# Patient Record
Sex: Female | Born: 1950 | Race: White | Hispanic: No | Marital: Married | Smoking: Never smoker
Health system: Southern US, Community
[De-identification: ages and names within clinical notes are randomized; demographics above are authoritative.]

## PROBLEM LIST (undated history)

## (undated) DIAGNOSIS — H18603 Keratoconus, unspecified, bilateral: Secondary | ICD-10-CM

## (undated) DIAGNOSIS — R112 Nausea with vomiting, unspecified: Secondary | ICD-10-CM

## (undated) DIAGNOSIS — Z9889 Other specified postprocedural states: Secondary | ICD-10-CM

## (undated) DIAGNOSIS — M8580 Other specified disorders of bone density and structure, unspecified site: Secondary | ICD-10-CM

## (undated) DIAGNOSIS — J45909 Unspecified asthma, uncomplicated: Secondary | ICD-10-CM

## (undated) HISTORY — DX: Keratoconus, unspecified, bilateral: H18.603

## (undated) HISTORY — PX: SALPINGOOPHORECTOMY: SHX82

## (undated) HISTORY — DX: Other specified disorders of bone density and structure, unspecified site: M85.80

## (undated) HISTORY — DX: Unspecified asthma, uncomplicated: J45.909

---
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
## 1971-04-14 HISTORY — PX: DILATION AND CURETTAGE OF UTERUS: SHX78

## 1994-04-13 HISTORY — PX: CHOLECYSTECTOMY: SHX55

## 1998-01-15 ENCOUNTER — Other Ambulatory Visit: Admission: RE | Admit: 1998-01-15 | Discharge: 1998-01-15 | Payer: Self-pay | Admitting: Gynecology

## 1999-01-13 ENCOUNTER — Inpatient Hospital Stay (HOSPITAL_COMMUNITY): Admission: AD | Admit: 1999-01-13 | Discharge: 1999-01-13 | Payer: Self-pay | Admitting: Gynecology

## 2001-04-13 HISTORY — PX: BREAST BIOPSY: SHX20

## 2001-04-13 HISTORY — PX: BREAST LUMPECTOMY: SHX2

## 2001-11-14 ENCOUNTER — Ambulatory Visit (HOSPITAL_COMMUNITY): Admission: RE | Admit: 2001-11-14 | Discharge: 2001-11-14 | Payer: Self-pay | Admitting: Obstetrics and Gynecology

## 2001-11-14 ENCOUNTER — Encounter: Payer: Self-pay | Admitting: Obstetrics and Gynecology

## 2001-12-02 ENCOUNTER — Ambulatory Visit (HOSPITAL_COMMUNITY): Admission: RE | Admit: 2001-12-02 | Discharge: 2001-12-02 | Payer: Self-pay | Admitting: Obstetrics and Gynecology

## 2001-12-02 ENCOUNTER — Encounter: Payer: Self-pay | Admitting: Obstetrics and Gynecology

## 2001-12-09 ENCOUNTER — Ambulatory Visit (HOSPITAL_COMMUNITY): Admission: RE | Admit: 2001-12-09 | Discharge: 2001-12-09 | Payer: Self-pay | Admitting: General Surgery

## 2002-08-31 ENCOUNTER — Encounter: Payer: Self-pay | Admitting: Obstetrics and Gynecology

## 2002-08-31 ENCOUNTER — Ambulatory Visit (HOSPITAL_COMMUNITY): Admission: RE | Admit: 2002-08-31 | Discharge: 2002-08-31 | Payer: Self-pay | Admitting: Obstetrics and Gynecology

## 2002-10-02 ENCOUNTER — Ambulatory Visit (HOSPITAL_COMMUNITY): Admission: RE | Admit: 2002-10-02 | Discharge: 2002-10-02 | Payer: Self-pay | Admitting: Obstetrics & Gynecology

## 2002-10-02 ENCOUNTER — Encounter: Payer: Self-pay | Admitting: Obstetrics and Gynecology

## 2003-01-24 ENCOUNTER — Ambulatory Visit (HOSPITAL_COMMUNITY): Admission: RE | Admit: 2003-01-24 | Discharge: 2003-01-24 | Payer: Self-pay | Admitting: Obstetrics and Gynecology

## 2003-01-24 ENCOUNTER — Encounter: Payer: Self-pay | Admitting: Obstetrics and Gynecology

## 2003-04-18 ENCOUNTER — Other Ambulatory Visit: Admission: RE | Admit: 2003-04-18 | Discharge: 2003-04-18 | Payer: Self-pay | Admitting: Dermatology

## 2003-05-11 ENCOUNTER — Other Ambulatory Visit: Admission: RE | Admit: 2003-05-11 | Discharge: 2003-05-11 | Payer: Self-pay | Admitting: Obstetrics and Gynecology

## 2004-01-31 ENCOUNTER — Ambulatory Visit (HOSPITAL_COMMUNITY): Admission: RE | Admit: 2004-01-31 | Discharge: 2004-01-31 | Payer: Self-pay | Admitting: Obstetrics and Gynecology

## 2004-04-13 HISTORY — PX: OTHER SURGICAL HISTORY: SHX169

## 2004-05-27 ENCOUNTER — Other Ambulatory Visit: Admission: RE | Admit: 2004-05-27 | Discharge: 2004-05-27 | Payer: Self-pay | Admitting: Obstetrics and Gynecology

## 2004-12-26 ENCOUNTER — Encounter (INDEPENDENT_AMBULATORY_CARE_PROVIDER_SITE_OTHER): Payer: Self-pay | Admitting: Specialist

## 2004-12-26 ENCOUNTER — Ambulatory Visit (HOSPITAL_COMMUNITY): Admission: RE | Admit: 2004-12-26 | Discharge: 2004-12-26 | Payer: Self-pay | Admitting: Obstetrics and Gynecology

## 2005-04-17 ENCOUNTER — Ambulatory Visit (HOSPITAL_COMMUNITY): Admission: RE | Admit: 2005-04-17 | Discharge: 2005-04-17 | Payer: Self-pay | Admitting: Obstetrics and Gynecology

## 2005-06-15 ENCOUNTER — Other Ambulatory Visit: Admission: RE | Admit: 2005-06-15 | Discharge: 2005-06-15 | Payer: Self-pay | Admitting: Obstetrics and Gynecology

## 2006-07-26 ENCOUNTER — Ambulatory Visit (HOSPITAL_COMMUNITY): Admission: RE | Admit: 2006-07-26 | Discharge: 2006-07-26 | Payer: Self-pay | Admitting: Obstetrics and Gynecology

## 2007-09-22 ENCOUNTER — Ambulatory Visit (HOSPITAL_COMMUNITY): Admission: RE | Admit: 2007-09-22 | Discharge: 2007-09-22 | Payer: Self-pay | Admitting: Obstetrics and Gynecology

## 2009-02-21 ENCOUNTER — Encounter: Payer: Self-pay | Admitting: Cardiology

## 2009-06-05 ENCOUNTER — Encounter (INDEPENDENT_AMBULATORY_CARE_PROVIDER_SITE_OTHER): Payer: Self-pay | Admitting: *Deleted

## 2009-06-05 ENCOUNTER — Ambulatory Visit: Payer: Self-pay | Admitting: Cardiology

## 2009-06-05 DIAGNOSIS — E663 Overweight: Secondary | ICD-10-CM | POA: Insufficient documentation

## 2009-06-05 DIAGNOSIS — R9431 Abnormal electrocardiogram [ECG] [EKG]: Secondary | ICD-10-CM

## 2009-06-05 DIAGNOSIS — R0789 Other chest pain: Secondary | ICD-10-CM

## 2009-06-20 ENCOUNTER — Telehealth (INDEPENDENT_AMBULATORY_CARE_PROVIDER_SITE_OTHER): Payer: Self-pay | Admitting: *Deleted

## 2009-07-03 ENCOUNTER — Encounter: Payer: Self-pay | Admitting: Cardiology

## 2009-07-03 ENCOUNTER — Ambulatory Visit: Payer: Self-pay | Admitting: Cardiology

## 2010-05-13 NOTE — Letter (Signed)
Summary: Graded Exercise Tolerance Test  [facility] [address]   [place], [place] [address]   Phone: [phone]  Fax: [phone]      [facility] Cardiovascular Services  Graded Exercise Tolerance Test    Yesenia Welch  Appointment Date:_  Appointment Time:_   Your doctor has ordered a stress test to help determine the condition of your  heart during exercise. If you take blood pressure medicine , ask your doctor if you should take it the day of your test. You may take your medication the day of your test. You may eat a light meal before your test.  Please be sure to bring the copy of your order with you.   You should dress comfortably, for example: Sweat pants, shorts, or skirt, Loose                                                                                                       short sleeved T-shirt, Rubber soled lace-up shoes (tennis shoes)  You will need to arrive 15 minutes before your appointment time. You will also need to enter at the Main Entrance of the hospital and go to the registration desk. They will direct you to the Cardiovascular Department on the third floor.  You will need to plan on being at the hospital for one hour from registration for this appointment.

## 2010-05-13 NOTE — Letter (Signed)
Summary: Internal Other/ PATIENT HISTORY FORM  Internal Other/ PATIENT HISTORY FORM   Imported By: Dorise Hiss 06/06/2009 09:42:04  _____________________________________________________________________  External Attachment:    Type:   Image     Comment:   External Document

## 2010-05-13 NOTE — Assessment & Plan Note (Signed)
Summary: NP-CHEST PAIN POOR R -WAVE PROGRESS   Visit Type:  Initial Consult Primary Provider:  Dr. Isac Sarna  CC:  Chest pain; Abnormal EKG.  History of Present Illness: The patient presents for evaluation of chest pain and abnormal EKG. She has no prior cardiac history. She says that over the last weeks to months she has been getting some chest discomfort. It is sporadic. It seems to happen with emotional stress. She describes a substernal vice-like sensation that can be 6/10 in intensity at its peak. There is no radiation. She can try to breathe through it. She has noticed a particularly stressful days that it may last to some degree all day long. There is no neck or arm discomfort. There is no associated nausea vomiting or diaphoresis. She is not particularly short of breath. She was able to walk 5 miles the other day without bringing this on. Her main form of exercises yoga and this will not cause any symptoms.  Of note she was noted recently to have an abnormal EKG with poor anterior R wave progression.  Preventive Screening-Counseling & Management  Alcohol-Tobacco     Smoking Status: never  Current Medications (verified): 1)  Valtrex 500 Mg Tabs (Valacyclovir Hcl) .... Take 1 Tablet By Mouth Once A Day 2)  Coq10 100 Mg Caps (Coenzyme Q10) .... Take 1 Tablet By Mouth Once A Day 3)  Loratadine 10 Mg Tabs (Loratadine) .... Take 1 Tablet By Mouth Once A Day As Needed For Seasonal Allergies 4)  Alendronate Sodium 70 Mg Tabs (Alendronate Sodium) .... Take 1 Tablet By Mouth Once Per Week  Allergies: 1)  ! Penicillin 2)  ! * Gluten  Comments:  Nurse/Medical Assistant: The patient's medications were reviewed with the patient and were updated in the Medication List. Pt brought medication bottles to office visit.  Cyril Loosen, RN, BSN (June 05, 2009 3:52 PM)  Past History:  Past Medical History: Keratoconus of both eyes Osteopenia  Past Surgical History: Reviewed  history from 04/19/2009 and no changes required. 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 Co 2nd marriage Has 2 children but one child is deceased from car wreck age 73 Daughter with 7 month old Drug Use - no Approx. 5 drinks per week Never tobacco Smoking Status:  never  Review of Systems       As stated in the HPI and negative for all other systems.   Vital Signs:  Patient profile:   60 year old female Height:      62 inches Weight:      139 pounds BMI:     25.52 Pulse rate:   92 / minute BP sitting:   107 / 72  (left arm) Cuff size:   regular  Vitals Entered By: Cyril Loosen, RN, BSN (June 05, 2009 3:49 PM)  Nutrition Counseling: Patient's BMI is greater than 25 and therefore counseled on weight management options. CC: Chest pain; Abnormal EKG   Physical Exam  General:  Well developed, well nourished, in no acute distress. Head:  normocephalic and atraumatic Eyes:  PERRLA/EOM intact; conjunctiva and lids normal. Mouth:  Teeth, gums and palate normal. Oral mucosa normal. Neck:  Neck supple, no JVD. No masses, thyromegaly or abnormal cervical nodes. Chest Wall:  no deformities or breast masses noted Lungs:  Clear bilaterally to auscultation and percussion. Abdomen:  Bowel sounds positive; abdomen soft and non-tender without masses, organomegaly, or hernias noted. No hepatosplenomegaly. Msk:  Back normal, normal gait. Muscle strength and tone normal. Extremities:  No clubbing or cyanosis. Neurologic:  Alert and oriented x 3. Skin:  Intact without lesions or rashes. Cervical Nodes:  no significant adenopathy Axillary Nodes:  no significant adenopathy Inguinal Nodes:  no significant  adenopathy Psych:  Normal affect.   Detailed Cardiovascular Exam  Neck    Carotids: Carotids full and equal bilaterally without bruits.      Neck Veins: Normal, no JVD.    Heart    Inspection: no deformities or lifts noted.      Palpation: normal PMI with no thrills palpable.      Auscultation: regular rate and rhythm, S1, S2 without murmurs, rubs, gallops, or clicks.    Vascular    Abdominal Aorta: no palpable masses, pulsations, or audible bruits.      Femoral Pulses: normal femoral pulses bilaterally.      Pedal Pulses: normal pedal pulses bilaterally.      Radial Pulses: normal radial pulses bilaterally.      Peripheral Circulation: no clubbing, cyanosis, or edema noted with normal capillary refill.     Impression & Recommendations:  Problem # 1:  CHEST DISCOMFORT (ICD-786.59) The patient has chest discomfort that has typical greater than atypical features. At this point the pretest probability of obstructive coronary disease is low. She does have a family history. I think screening with a plano exercise treadmill is warranted.  Problem # 2:  ABNORMAL ELECTROCARDIOGRAM (ICD-794.31) I did repeat an EKG today. The R wave progression across the anterior leads with slightly improved with probable change in lead placement. Again I have a low suspicion of obstructive coronary disease but will evaluate as above. Orders: EKG w/ Interpretation (93000)  Problem # 3:  OVERWEIGHT (ICD-278.02) She has some mild weight issues. She doesn't exercise and we discussed this at length.  Other Orders: GXT (GXT)  Patient Instructions: 1)  Your physician has requested that you have an exercise tolerance test.  For further information please visit [email or web address]  Please also follow instruction sheet, as given. 2)  No follow up needed.

## 2010-05-13 NOTE — Progress Notes (Signed)
Summary: Pending GXT  Phone Note Outgoing Call Call back at [facility] Phone [phone]   Call placed by: Cyril Loosen, RN, BSN,  June 20, 2009 3:40 PM Call placed to: Patient Summary of Call: Left message to call back on machine to discuss GXT that was scheduled for 3/7 but doesn't appear to have been done. Initial call taken by: Cyril Loosen, RN, BSN,  June 20, 2009 3:40 PM     Appended Document: Pending GXT Pt spoke with [facility] and rescheduled test to 07/03/09

## 2010-05-13 NOTE — Letter (Signed)
Summary: [facility] FAMILY MEDIC[facility]  [facility]   Imported By: Zachary George 04/19/2009 17:04:49  _____________________________________________________________________  External Attachment:    Type:   Image     Comment:   External Document

## 2010-08-29 NOTE — Op Note (Signed)
   NAME:  Yesenia Welch, Yesenia Welch                           ACCOUNT NO.:  [id number]   MEDICAL RECORD NO.:  [id number]                   PATIENT TYPE:  AMB   LOCATION:  DAY                                  FACILITY:  [facility]   PHYSICIAN:  Marlane Hatcher, M.D.           DATE OF BIRTH:  1950-06-12   DATE OF PROCEDURE:  DATE OF DISCHARGE:                                 OPERATIVE REPORT   SURGEON:  Marlane Hatcher, M.D.   PREOPERATIVE DIAGNOSIS:  Abnormal left mammogram with palpable mass.   PROCEDURE:  Left partial mastectomy.   HISTORY OF PRESENT ILLNESS:  This is a 60 year old white female who had a  palpable nodule in the left breast at approximately the 12 o'clock position.  This was marked preoperatively in the holding area and we planned for  excisional biopsy as recommended by the radiologist. The patient had no  family history of carcinoma of the breast and no other findings on clinical  examination.   We discussed surgery in detail with the patient including the risk not  limited to but including bleeding and infection and the possibility that  more surgery may be required. Informed consent was obtained.   TECHNIQUE:  The patient was placed in the supine position and after the  adequate administration of general anesthesia via LMA anesthesia, the  patient's left hemithorax was prepped with Betadine solution and draped in  the usual manner. The palpable nodule was marked with a sterile marking pen  and then a curvilinear incision was carried out over this area and the  breast tissue was removed and sent for permanent section. The bleeding was  controlled with the cautery device and the breast tissue was then irrigated  with normal saline solution and the breast tissue was then approximated with  3-0 Polysorb and closed subcuticularly with a 5-0 Polysorb suture. Steri-  Strips were further used to approximate the skin. Neosporin and a sterile  dressing were applied. Prior to  closure, a sponge, needle and instrument  counts were found to be correct. The patient received 750 cc of crystalloids  intraoperatively. No drains were placed and there were no complications.                                               Marlane Hatcher, M.D.    WSB/MEDQ  D:  12/09/2001  T:  12/10/2001  Job:  [id number]   cc:   Tilda Burrow, M.D.

## 2010-08-29 NOTE — Consult Note (Signed)
   NAME:  Yesenia Welch, Yesenia Welch                           ACCOUNT NO.:  [id number]   MEDICAL RECORD NO.:  [id number]                   PATIENT TYPE:  AMB   LOCATION:  DAY                                  FACILITY:  [facility]   PHYSICIAN:  Marlane Hatcher, M.D.           DATE OF BIRTH:  21-Sep-1950   DATE OF CONSULTATION:  12/05/2001  DATE OF DISCHARGE:                           GENERAL SURGERY CONSULTATION   Thank you kindly for sending the patient my way.  I saw her in my office on  December 05, 2001, at which time I reviewed her mammograms and performed a  complete history and physical in preparation for a left partial mastectomy  for a palpable mass and for an abnormal mammogram.  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  She has no family history of first-line  relatives with carcinoma of the breast.   We have planned for an excisional biopsy on Friday and I will make sure you  get a copy of the pathology report for your records.  I discussed the  surgery in detail with both the patient and her husband and all questions  were answered and informed consent was obtained.   Again, I thank you kindly for your confidence in sending her my way.  I will  keep you informed, as always.                                               Marlane Hatcher, M.D.    WSB/MEDQ  D:  12/05/2001  T:  12/05/2001  Job:  [id number]

## 2010-08-29 NOTE — Op Note (Signed)
NAME:  Yesenia Welch, Yesenia Welch               ACCOUNT NO.:  [id number]   MEDICAL RECORD NO.:  [id number]          PATIENT TYPE:  AMB   LOCATION:  SDC                           FACILITY:  [facility]   PHYSICIAN:  Dois Davenport A. Rivard, M.D. DATE OF BIRTH:  1951-03-30   DATE OF PROCEDURE:  12/26/2004  DATE OF DISCHARGE:                                 OPERATIVE REPORT   PREOPERATIVE DIAGNOSIS:  Dysfunctional uterine bleeding.   POSTOPERATIVE DIAGNOSIS:  Dysfunctional uterine bleeding.   ANESTHESIA:  General.   PROCEDURE:  Hysteroscopy D&C, endometrial ablation with NovaSure and  rollerball.   SURGEON:  Crist Fat. Rivard, M.D.   ASSISTANT:  No assistant.   ESTIMATED BLOOD LOSS:  Minimal.   PROCEDURE:  After being informed of the planned procedure with possible  complications including bleeding, infection, uterine perforation with  subsequent intra-abdominal organ injury as well as failure of treatment,  informed consent is obtained. The patient is taken to OR #3, given general  anesthesia with the laryngeal mask without complication. She is placed in  the lithotomy position, prepped and draped in a sterile fashion and her  bladder is emptied with an in-and-out Foley catheter. Pelvic exam reveals an  anteverted uterus normal size and shape, two normal adnexa. A weighted  speculum was inserted. Anterior lip of the cervix was grasped with a  tenaculum and cervical length was determined to be 2.5 cm. The cervix was  then easily dilated with Hegar dilator at #25 which allows [place] to sound the  uterus at 7 cm for total cavity length of 4.5 cm. Hysteroscope was inserted  easily with LR at the maximum pressure of 90 mmHg. We visualized the entire  endometrial cavity including both tubal ostia and no anomalies was noted.  Hysteroscope was removed and a sharp curette is used to curette the uterine  walls and remove a fair amount of normal-appearing endometrium sent to  pathology. NovaSure instrument was then  inserted easily, opened and moved.  Cavity width was 3 cm. Cavity integrity was checked and normal. We proceeded  with ablation at a power of 74 for 1 minutes 47. NovaSure instrument was  removed and a diagnostic hysteroscope was reinserted. At this time we note  that there was complete blanching of most of the cavity but the fundus still  is not blanched. Decision is made to complete the procedure with rollerball.  The diagnostic hysteroscope was removed. Cervix was dilated until Hegar #29  operative hysteroscope was inserted easily. Cavity is flushed with sorbitol  3% and then at a maximum pressure of 90 mmHg. We performed rollerball  ablation of the fundal area of the uterus with complete blanching. At the  end of the procedure, we are satisfied with the endometrial ablation.  Instruments were removed.  Instrument and sponge count is complete x2. Estimated blood loss was  minimal. Water deficit is a total of 100 mL of which only 60 mL was sorbitol  3%. The procedure is well tolerated by the patient who was taken to recovery  room in a well and stable condition.  Crist Fat Rivard, M.D.  Electronically Signed     SAR/MEDQ  D:  12/26/2004  T:  12/26/2004  Job:  [id number]

## 2010-08-29 NOTE — H&P (Signed)
NAME:  Yesenia Welch, TEODORO               ACCOUNT NO.:  [id number]   MEDICAL RECORD NO.:  [id number]          PATIENT TYPE:  AMB   LOCATION:  SDC                           FACILITY:  [facility]   PHYSICIAN:  Dois Davenport A. Rivard, M.D. DATE OF BIRTH:  Apr 16, 1950   DATE OF ADMISSION:  DATE OF DISCHARGE:                                HISTORY & PHYSICAL   REASON FOR ADMISSION:  Menorrhagia.   HISTORY OF PRESENT ILLNESS:  This is a 60 year old married white female,  gravida, para 1, who reports persistent chronic menorrhagia for multiple  years.  She was first evaluated at [facility] in April 2004  with the same complaint with a previous endometrial biopsy in June 2004  which was benign.  A sonohysterogram performed on June 2004 revealed a  normal endometrial lining.  The patient has been using cyclic progesterone  supplement on and off with partial improvement but over time, after exposure  to the same progesterone for multiple months, her period returned to its  original flow, lasting 3-4 days, using 3-4 pads a day with large clots.  This is associated with dysmenorrhea of a qualified intensity of 4/10.  The  bleeding can also last longer, up to 10 days and be heavier.   With the persistent history of repetitive episodes of prolonged periods and  menorrhagia, the patient has requested endometrial ablation.  On December 11, 2004, we proceeded with a sonohysterogram revealing multiple small fibroids  throughout the uterus but no endometrial pathology.  A small right ovarian  cyst measuring 2.8 x 2.2 x 2.6 cm was also visualized.  Overall endometrial  lining was measured at 0.84 cm on day 19 of her cycle.   REVIEW OF SYSTEMS:  CONSTITUTIONAL:  Negative.  HEAD/EYES/EARS/NOSE/THROAT:  Negative.  Thyroid normal.  CARDIOVASCULAR:  Negative.  GENITOURINARY:  Negative.  GASTROINTESTINAL:  Negative.  NEUROLOGICAL:  Negative.   PAST MEDICAL HISTORY:  1.  Osteopenia.  2.  Status post  tonsillectomy.  3.  Status post cesarean x 2.  4.  Status post bilateral tubal ligation.  5.  Status post cholecystectomy.  6.  Status post breast biopsy for a benign condition.   ALLERGIES:  PENICILLIN.   CURRENT MEDICATIONS:  1.  Aygestin 5 mg.  2.  Calcium supplement.  3.  DHEA supplement.  4.  B12 supplement.  5.  Multivitamin.  6.  Glucosamine with chondroitin.  7.  Vitamin C.  8.  Claritin.   SOCIAL HISTORY:  Married, nonsmoker, works as an Warehouse manager for [employer].   FAMILY HISTORY:  Negative for feminine or colon cancer.   PHYSICAL EXAMINATION:  VITAL SIGNS:  Current weight is 132 pounds for a  height of 5 feet 1 inch.  Blood pressure 110/60.  HEAD/EYES/EARS/NOSE/THROAT:  Negative.  Thyroid not enlarged.  HEART:  Regular rate and rhythm.  CHEST:  Clear.  BREASTS:  Normal.  BACK:  No CVA tenderness.  ABDOMEN:  No tenderness, masses, or hepatosplenomegaly.  EXTREMITIES:  Negative.  NEUROLOGICAL:  Within normal limits.  GYNECOLOGY EXAM:  Normal external genitalia, normal  vagina, normal cervix.  Uterus is normal size and shape, adnexa normal.  Rectovaginal normal.   ASSESSMENT:  Persistent chronic menorrhagia despite normal work-up and  despite multiple trials of treatment.  The patient is desiring endometrial  ablation.   PLAN:  We will proceed with hysteroscopy, D&C, and endometrial ablation with  NovaSure.  Risks and benefits of the procedure have been thoroughly reviewed  with the patient including bleeding, infection, uterine perforation with  subsequent intra-abdominal organ damage.  Informed consent is obtained.      Crist Fat Rivard, M.D.  Electronically Signed     SAR/MEDQ  D:  12/25/2004  T:  12/25/2004  Job:  [id number]

## 2011-07-06 ENCOUNTER — Other Ambulatory Visit (HOSPITAL_COMMUNITY): Payer: Self-pay | Admitting: Internal Medicine

## 2011-07-06 DIAGNOSIS — Z139 Encounter for screening, unspecified: Secondary | ICD-10-CM

## 2011-08-18 ENCOUNTER — Ambulatory Visit (HOSPITAL_COMMUNITY)
Admission: RE | Admit: 2011-08-18 | Discharge: 2011-08-18 | Disposition: A | Discharge status: Home / Self Care | Payer: BC Managed Care – PPO | Source: Ambulatory Visit | Attending: Internal Medicine | Admitting: Internal Medicine

## 2011-08-18 DIAGNOSIS — Z139 Encounter for screening, unspecified: Secondary | ICD-10-CM

## 2011-08-18 DIAGNOSIS — Z1231 Encounter for screening mammogram for malignant neoplasm of breast: Secondary | ICD-10-CM | POA: Insufficient documentation

## 2011-10-09 ENCOUNTER — Encounter: Payer: Self-pay | Admitting: *Deleted

## 2012-02-12 HISTORY — PX: COLONOSCOPY: SHX174

## 2012-05-10 ENCOUNTER — Other Ambulatory Visit (HOSPITAL_COMMUNITY): Payer: Self-pay | Admitting: Internal Medicine

## 2012-05-10 DIAGNOSIS — M8580 Other specified disorders of bone density and structure, unspecified site: Secondary | ICD-10-CM

## 2012-05-17 ENCOUNTER — Ambulatory Visit (HOSPITAL_COMMUNITY)
Admission: RE | Admit: 2012-05-17 | Discharge: 2012-05-17 | Disposition: A | Discharge status: Home / Self Care | Payer: BC Managed Care – PPO | Source: Ambulatory Visit | Attending: Internal Medicine | Admitting: Internal Medicine

## 2012-05-17 DIAGNOSIS — M949 Disorder of cartilage, unspecified: Secondary | ICD-10-CM | POA: Insufficient documentation

## 2012-05-17 DIAGNOSIS — M8580 Other specified disorders of bone density and structure, unspecified site: Secondary | ICD-10-CM

## 2012-05-17 DIAGNOSIS — Z780 Asymptomatic menopausal state: Secondary | ICD-10-CM | POA: Insufficient documentation

## 2012-05-17 DIAGNOSIS — M899 Disorder of bone, unspecified: Secondary | ICD-10-CM | POA: Insufficient documentation

## 2012-08-15 ENCOUNTER — Other Ambulatory Visit (HOSPITAL_COMMUNITY): Payer: Self-pay | Admitting: Internal Medicine

## 2012-08-15 DIAGNOSIS — Z139 Encounter for screening, unspecified: Secondary | ICD-10-CM

## 2012-08-23 ENCOUNTER — Ambulatory Visit (HOSPITAL_COMMUNITY)
Admission: RE | Admit: 2012-08-23 | Discharge: 2012-08-23 | Disposition: A | Discharge status: Home / Self Care | Payer: BC Managed Care – PPO | Source: Ambulatory Visit | Attending: Internal Medicine | Admitting: Internal Medicine

## 2012-08-23 DIAGNOSIS — Z139 Encounter for screening, unspecified: Secondary | ICD-10-CM

## 2012-08-23 DIAGNOSIS — Z1231 Encounter for screening mammogram for malignant neoplasm of breast: Secondary | ICD-10-CM | POA: Insufficient documentation

## 2013-04-24 ENCOUNTER — Ambulatory Visit
Admission: RE | Admit: 2013-04-24 | Discharge: 2013-04-24 | Disposition: A | Discharge status: Home / Self Care | Payer: BC Managed Care – PPO | Source: Ambulatory Visit | Attending: Allergy | Admitting: Allergy

## 2013-04-24 ENCOUNTER — Other Ambulatory Visit: Payer: Self-pay | Admitting: Allergy

## 2013-04-24 DIAGNOSIS — J45909 Unspecified asthma, uncomplicated: Secondary | ICD-10-CM

## 2013-07-20 ENCOUNTER — Other Ambulatory Visit (HOSPITAL_COMMUNITY): Payer: Self-pay | Admitting: Internal Medicine

## 2013-07-20 DIAGNOSIS — Z1231 Encounter for screening mammogram for malignant neoplasm of breast: Secondary | ICD-10-CM

## 2013-08-28 ENCOUNTER — Ambulatory Visit (HOSPITAL_COMMUNITY)
Admission: RE | Admit: 2013-08-28 | Discharge: 2013-08-28 | Disposition: A | Discharge status: Home / Self Care | Payer: BC Managed Care – PPO | Source: Ambulatory Visit | Attending: Internal Medicine | Admitting: Internal Medicine

## 2013-08-28 ENCOUNTER — Ambulatory Visit (HOSPITAL_COMMUNITY): Payer: BC Managed Care – PPO

## 2013-08-28 DIAGNOSIS — Z1231 Encounter for screening mammogram for malignant neoplasm of breast: Secondary | ICD-10-CM | POA: Insufficient documentation

## 2014-05-14 ENCOUNTER — Other Ambulatory Visit (HOSPITAL_COMMUNITY): Payer: Self-pay | Admitting: Internal Medicine

## 2014-05-14 DIAGNOSIS — M8580 Other specified disorders of bone density and structure, unspecified site: Secondary | ICD-10-CM

## 2014-05-28 ENCOUNTER — Ambulatory Visit (HOSPITAL_COMMUNITY): Admission: RE | Admit: 2014-05-28 | Payer: BC Managed Care – PPO | Source: Ambulatory Visit

## 2014-06-01 ENCOUNTER — Ambulatory Visit (HOSPITAL_COMMUNITY)
Admission: RE | Admit: 2014-06-01 | Discharge: 2014-06-01 | Disposition: A | Discharge status: Home / Self Care | Payer: BC Managed Care – PPO | Source: Ambulatory Visit | Attending: Internal Medicine | Admitting: Internal Medicine

## 2014-06-01 DIAGNOSIS — M8580 Other specified disorders of bone density and structure, unspecified site: Secondary | ICD-10-CM

## 2014-10-17 ENCOUNTER — Other Ambulatory Visit (HOSPITAL_COMMUNITY): Payer: Self-pay | Admitting: Internal Medicine

## 2014-10-17 DIAGNOSIS — Z1231 Encounter for screening mammogram for malignant neoplasm of breast: Secondary | ICD-10-CM

## 2014-11-07 ENCOUNTER — Ambulatory Visit (HOSPITAL_COMMUNITY)
Admission: RE | Admit: 2014-11-07 | Discharge: 2014-11-07 | Disposition: A | Discharge status: Home / Self Care | Payer: BC Managed Care – PPO | Source: Ambulatory Visit | Attending: Internal Medicine | Admitting: Internal Medicine

## 2014-11-07 DIAGNOSIS — Z1231 Encounter for screening mammogram for malignant neoplasm of breast: Secondary | ICD-10-CM | POA: Diagnosis present

## 2015-11-15 ENCOUNTER — Other Ambulatory Visit (HOSPITAL_COMMUNITY): Payer: Self-pay | Admitting: Internal Medicine

## 2015-11-15 DIAGNOSIS — Z1231 Encounter for screening mammogram for malignant neoplasm of breast: Secondary | ICD-10-CM

## 2015-11-22 ENCOUNTER — Ambulatory Visit (HOSPITAL_COMMUNITY)
Admission: RE | Admit: 2015-11-22 | Discharge: 2015-11-22 | Disposition: A | Discharge status: Home / Self Care | Payer: Medicare Other | Source: Ambulatory Visit | Attending: Internal Medicine | Admitting: Internal Medicine

## 2015-11-22 DIAGNOSIS — Z1231 Encounter for screening mammogram for malignant neoplasm of breast: Secondary | ICD-10-CM | POA: Diagnosis not present

## 2016-05-25 ENCOUNTER — Ambulatory Visit (HOSPITAL_COMMUNITY)
Admission: RE | Admit: 2016-05-25 | Discharge: 2016-05-25 | Disposition: A | Discharge status: Home / Self Care | Payer: Medicare Other | Source: Ambulatory Visit | Attending: Internal Medicine | Admitting: Internal Medicine

## 2016-05-25 ENCOUNTER — Other Ambulatory Visit (HOSPITAL_COMMUNITY): Payer: Self-pay | Admitting: Internal Medicine

## 2016-05-25 DIAGNOSIS — R52 Pain, unspecified: Secondary | ICD-10-CM

## 2016-05-25 DIAGNOSIS — M545 Low back pain: Secondary | ICD-10-CM | POA: Insufficient documentation

## 2016-05-25 DIAGNOSIS — M5136 Other intervertebral disc degeneration, lumbar region: Secondary | ICD-10-CM | POA: Diagnosis not present

## 2016-05-26 ENCOUNTER — Other Ambulatory Visit (HOSPITAL_COMMUNITY): Payer: Self-pay | Admitting: Internal Medicine

## 2016-05-26 DIAGNOSIS — Z780 Asymptomatic menopausal state: Secondary | ICD-10-CM

## 2016-06-03 ENCOUNTER — Other Ambulatory Visit (HOSPITAL_COMMUNITY): Payer: Medicare Other

## 2016-06-09 ENCOUNTER — Ambulatory Visit (HOSPITAL_COMMUNITY)
Admission: RE | Admit: 2016-06-09 | Discharge: 2016-06-09 | Disposition: A | Discharge status: Home / Self Care | Payer: Medicare Other | Source: Ambulatory Visit | Attending: Internal Medicine | Admitting: Internal Medicine

## 2016-06-09 DIAGNOSIS — M8580 Other specified disorders of bone density and structure, unspecified site: Secondary | ICD-10-CM | POA: Diagnosis present

## 2016-06-09 DIAGNOSIS — M85851 Other specified disorders of bone density and structure, right thigh: Secondary | ICD-10-CM | POA: Diagnosis not present

## 2016-06-09 DIAGNOSIS — Z780 Asymptomatic menopausal state: Secondary | ICD-10-CM | POA: Diagnosis present

## 2016-06-09 DIAGNOSIS — M8588 Other specified disorders of bone density and structure, other site: Secondary | ICD-10-CM | POA: Insufficient documentation

## 2016-12-10 ENCOUNTER — Other Ambulatory Visit (HOSPITAL_COMMUNITY): Payer: Self-pay | Admitting: Internal Medicine

## 2016-12-10 DIAGNOSIS — Z1231 Encounter for screening mammogram for malignant neoplasm of breast: Secondary | ICD-10-CM

## 2016-12-16 ENCOUNTER — Ambulatory Visit (HOSPITAL_COMMUNITY)
Admission: RE | Admit: 2016-12-16 | Discharge: 2016-12-16 | Disposition: A | Discharge status: Home / Self Care | Payer: Medicare Other | Source: Ambulatory Visit | Attending: Internal Medicine | Admitting: Internal Medicine

## 2016-12-16 ENCOUNTER — Encounter (HOSPITAL_COMMUNITY): Payer: Self-pay

## 2016-12-16 DIAGNOSIS — Z1231 Encounter for screening mammogram for malignant neoplasm of breast: Secondary | ICD-10-CM | POA: Diagnosis not present

## 2016-12-17 ENCOUNTER — Inpatient Hospital Stay (HOSPITAL_COMMUNITY): Admission: RE | Admit: 2016-12-17 | Payer: Medicare Other | Source: Ambulatory Visit

## 2017-06-07 ENCOUNTER — Ambulatory Visit (HOSPITAL_COMMUNITY)
Admission: RE | Admit: 2017-06-07 | Discharge: 2017-06-07 | Disposition: A | Discharge status: Home / Self Care | Payer: Medicare Other | Source: Ambulatory Visit | Attending: Internal Medicine | Admitting: Internal Medicine

## 2017-06-07 ENCOUNTER — Other Ambulatory Visit (HOSPITAL_COMMUNITY): Payer: Self-pay | Admitting: Internal Medicine

## 2017-06-07 DIAGNOSIS — M79662 Pain in left lower leg: Secondary | ICD-10-CM

## 2018-02-03 ENCOUNTER — Other Ambulatory Visit (HOSPITAL_COMMUNITY): Payer: Self-pay | Admitting: Internal Medicine

## 2018-02-03 DIAGNOSIS — Z1231 Encounter for screening mammogram for malignant neoplasm of breast: Secondary | ICD-10-CM

## 2018-02-16 ENCOUNTER — Ambulatory Visit (HOSPITAL_COMMUNITY)
Admission: RE | Admit: 2018-02-16 | Discharge: 2018-02-16 | Disposition: A | Discharge status: Home / Self Care | Payer: Medicare Other | Source: Ambulatory Visit | Attending: Internal Medicine | Admitting: Internal Medicine

## 2018-02-16 DIAGNOSIS — Z1231 Encounter for screening mammogram for malignant neoplasm of breast: Secondary | ICD-10-CM | POA: Insufficient documentation

## 2018-03-02 ENCOUNTER — Telehealth: Payer: Self-pay | Admitting: *Deleted

## 2018-03-02 NOTE — Telephone Encounter (Signed)
Called and spoke with the patient. Scheduled a new patient appt for 11/26.

## 2018-03-04 ENCOUNTER — Encounter: Payer: Self-pay | Admitting: *Deleted

## 2018-03-08 ENCOUNTER — Inpatient Hospital Stay: Payer: Medicare Other | Attending: Gynecology | Admitting: Gynecology

## 2018-03-08 ENCOUNTER — Encounter: Payer: Self-pay | Admitting: Gynecology

## 2018-03-08 VITALS — BP 153/85 | HR 73 | Temp 98.2°F | Resp 18 | Ht 61.0 in | Wt 134.0 lb

## 2018-03-08 DIAGNOSIS — R1031 Right lower quadrant pain: Secondary | ICD-10-CM | POA: Diagnosis not present

## 2018-03-08 DIAGNOSIS — N83201 Unspecified ovarian cyst, right side: Secondary | ICD-10-CM
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
## 2018-03-08 NOTE — Patient Instructions (Signed)
Preparing for your Surgery  Plan for surgery on March 22, 2018 with Dr. Everitt Amber at [facility] will be scheduled for a robotic assisted bilateral salpingo-oophorectomy, possible staging.   Pre-operative Testing -You will receive a phone call from presurgical testing at [facility] to arrange for a pre-operative testing appointment before your surgery.  This appointment normally occurs one to two weeks before your scheduled surgery.   -Bring your insurance card, copy of an advanced directive if applicable, medication list  -At that visit, you will be asked to sign a consent for a possible blood transfusion in case a transfusion becomes necessary during surgery.  The need for a blood transfusion is rare but having consent is a necessary part of your care.     -You should not be taking blood thinners or aspirin at least ten days prior to surgery unless instructed by your surgeon.  Day Before Surgery at [facility] will be asked to take in a light diet the day before surgery.  Avoid carbonated beverages.  You will be advised to have nothing to eat or drink after midnight the evening before.    Eat a light diet the day before surgery.  Examples including soups, broths, toast, yogurt, mashed potatoes.  Things to avoid include carbonated beverages (fizzy beverages), raw fruits and raw vegetables, or beans.   If your bowels are filled with gas, your surgeon will have difficulty visualizing your pelvic organs which increases your surgical risks.  Your role in recovery Your role is to become active as soon as directed by your doctor, while still giving yourself time to heal.  Rest when you feel tired. You will be asked to do the following in order to speed your recovery:  - Cough and breathe deeply. This helps toclear and expand your lungs and can prevent pneumonia. You may be given a spirometer to practice deep breathing. A staff member will show you how to use  the spirometer. - Do mild physical activity. Walking or moving your legs help your circulation and body functions return to normal. A staff member will help you when you try to walk and will provide you with simple exercises. Do not try to get up or walk alone the first time. - Actively manage your pain. Managing your pain lets you move in comfort. We will ask you to rate your pain on a scale of zero to 10. It is your responsibility to tell your doctor or nurse where and how much you hurt so your pain can be treated.  Special Considerations -If you are diabetic, you may be placed on insulin after surgery to have closer control over your blood sugars to promote healing and recovery.  This does not mean that you will be discharged on insulin.  If applicable, your oral antidiabetics will be resumed when you are tolerating a solid diet.  -Your final pathology results from surgery should be available around one week after surgery and the results will be relayed to you when available.  -Dr. Lahoma Crocker is the Surgeon that assists your GYN Oncologist with surgery.  The next day after your surgery you will either see your GYN Oncologist, Dr. Precious Haws, or Dr. Lahoma Crocker.  -FMLA forms can be faxed to [phone] and please allow 5-7 business days for completion.   Blood Transfusion Information WHAT IS A BLOOD TRANSFUSION? A transfusion is the replacement of blood or some of its parts. Blood is made up of multiple cells  which provide different functions.  Red blood cells carry oxygen and are used for blood loss replacement.  White blood cells fight against infection.  Platelets control bleeding.  Plasma helps clot blood.  Other blood products are available for specialized needs, such as hemophilia or other clotting disorders. BEFORE THE TRANSFUSION  Who gives blood for transfusions?   You may be able to donate blood to be used at a later date on yourself (autologous  donation).  Relatives can be asked to donate blood. This is generally not any safer than if you have received blood from a stranger. The same precautions are taken to ensure safety when a relative's blood is donated.  Healthy volunteers who are fully evaluated to make sure their blood is safe. This is blood bank blood. Transfusion therapy is the safest it has ever been in the practice of medicine. Before blood is taken from a donor, a complete history is taken to make sure that person has no history of diseases nor engages in risky social behavior (examples are intravenous drug use or sexual activity with multiple partners). The donor's travel history is screened to minimize risk of transmitting infections, such as malaria. The donated blood is tested for signs of infectious diseases, such as HIV and hepatitis. The blood is then tested to be sure it is compatible with you in order to minimize the chance of a transfusion reaction. If you or a relative donates blood, this is often done in anticipation of surgery and is not appropriate for emergency situations. It takes many days to process the donated blood. RISKS AND COMPLICATIONS Although transfusion therapy is very safe and saves many lives, the main dangers of transfusion include:   Getting an infectious disease.  Developing a transfusion reaction. This is an allergic reaction to something in the blood you were given. Every precaution is taken to prevent this. The decision to have a blood transfusion has been considered carefully by your caregiver before blood is given. Blood is not given unless the benefits outweigh the risks.

## 2018-03-16 NOTE — Patient Instructions (Addendum)
Yesenia Welch  03/16/2018   Your procedure is scheduled on: 03-22-18  Report to [facility] Main  Entrance             Report to admitting at     1100 AM    Call this number if you have problems the morning of surgery [phone]    Remember: Eat a light diet the day before surgery.  Examples including soups, broths, toast, yogurt, mashed potatoes.  Things to avoid include carbonated beverages (fizzy beverages), raw fruits and raw vegetables, or beans.   If your bowels are filled with gas, your surgeon will have difficulty visualizing your pelvic organs which increases your surgical risks.  NO SOLID FOOD AFTER MIDNIGHT THE NIGHT PRIOR TO SURGERY. NOTHING BY MOUTH EXCEPT CLEAR LIQUIDS UNTIL 3 HOURS PRIOR TO [place] SURGERY. PLEASE FINISH ENSURE DRINK   PER SURGEON ORDER 3 HOURS PRIOR TO SCHEDULED SURGERY TIME WHICH NEEDS TO BE COMPLETED AT _______1000 am then nothing by mouth    CLEAR LIQUID DIET   Foods Allowed                                                                     Foods Excluded  Coffee and tea, regular and decaf                             liquids that you cannot  Plain Jell-O in any flavor                                             see through such as: Fruit ices (not with fruit pulp)                                     milk, soups, orange juice  Iced Popsicles                                    All solid food Carbonated beverages, regular and diet                                    Cranberry, grape and apple juices Sports drinks like Gatorade Lightly seasoned clear broth or consume(fat free) Sugar, honey syrup   _____________________________________________________________________   BRUSH YOUR TEETH RINSE YOUR MOUTH BUT NO CHEWING GUM CANDY OR MINTS.     Take these medicines the morning of surgery with A SIP OF WATER: zyrtec, inhalers and bring, flonase                                You may not have any metal on your body  including hair pins and  piercings  Do not wear jewelry, make-up, lotions, powders or perfumes, deodorant             Do not wear nail polish.  Do not shave  48 hours prior to surgery.     Do not bring valuables to the hospital. [place].  Contacts, dentures or bridgework may not be worn into surgery.      Patients discharged the day of surgery will not be allowed to drive home.  Name and phone number of your driver:  Special Instructions: N/A              Please read over the following fact sheets you were given: _____________________________________________________________________          [facility] - Preparing for Surgery Before surgery, you can play an important role.  Because skin is not sterile, your skin needs to be as free of germs as possible.  You can reduce the number of germs on your skin by washing with CHG (chlorahexidine gluconate) soap before surgery.  CHG is an antiseptic cleaner which kills germs and bonds with the skin to continue killing germs even after washing. Please DO NOT use if you have an allergy to CHG or antibacterial soaps.  If your skin becomes reddened/irritated stop using the CHG and inform your nurse when you arrive at Short Stay. Do not shave (including legs and underarms) for at least 48 hours prior to the first CHG shower.  You may shave your face/neck. Please follow these instructions carefully:  1.  Shower with CHG Soap the night before surgery and the  morning of Surgery.  2.  If you choose to wash your hair, wash your hair first as usual with your  normal  shampoo.  3.  After you shampoo, rinse your hair and body thoroughly to remove the  shampoo.                           4.  Use CHG as you would any other liquid soap.  You can apply chg directly  to the skin and wash                       Gently with a scrungie or clean washcloth.  5.  Apply the CHG Soap to your body ONLY FROM THE  NECK DOWN.   Do not use on face/ open                           Wound or open sores. Avoid contact with eyes, ears mouth and genitals (private parts).                       Wash face,  Genitals (private parts) with your normal soap.             6.  Wash thoroughly, paying special attention to the area where your surgery  will be performed.  7.  Thoroughly rinse your body with warm water from the neck down.  8.  DO NOT shower/wash with your normal soap after using and rinsing off  the CHG Soap.                9.  Pat yourself dry with a clean towel.  10.  Wear clean pajamas.            11.  Place clean sheets on your bed the night of your first shower and do not  sleep with pets. Day of Surgery : Do not apply any lotions/deodorants the morning of surgery.  Please wear clean clothes to the hospital/surgery center.  FAILURE TO FOLLOW THESE INSTRUCTIONS MAY RESULT IN THE CANCELLATION OF YOUR SURGERY PATIENT SIGNATURE_________________________________  NURSE SIGNATURE__________________________________  ________________________________________________________________________  WHAT IS A BLOOD TRANSFUSION? Blood Transfusion Information  A transfusion is the replacement of blood or some of its parts. Blood is made up of multiple cells which provide different functions.  Red blood cells carry oxygen and are used for blood loss replacement.  White blood cells fight against infection.  Platelets control bleeding.  Plasma helps clot blood.  Other blood products are available for specialized needs, such as hemophilia or other clotting disorders. BEFORE THE TRANSFUSION  Who gives blood for transfusions?   Healthy volunteers who are fully evaluated to make sure their blood is safe. This is blood bank blood. Transfusion therapy is the safest it has ever been in the practice of medicine. Before blood is taken from a donor, a complete history is taken to make sure that person has no history  of diseases nor engages in risky social behavior (examples are intravenous drug use or sexual activity with multiple partners). The donor's travel history is screened to minimize risk of transmitting infections, such as malaria. The donated blood is tested for signs of infectious diseases, such as HIV and hepatitis. The blood is then tested to be sure it is compatible with you in order to minimize the chance of a transfusion reaction. If you or a relative donates blood, this is often done in anticipation of surgery and is not appropriate for emergency situations. It takes many days to process the donated blood. RISKS AND COMPLICATIONS Although transfusion therapy is very safe and saves many lives, the main dangers of transfusion include:   Getting an infectious disease.  Developing a transfusion reaction. This is an allergic reaction to something in the blood you were given. Every precaution is taken to prevent this. The decision to have a blood transfusion has been considered carefully by your caregiver before blood is given. Blood is not given unless the benefits outweigh the risks. AFTER THE TRANSFUSION  Right after receiving a blood transfusion, you will usually feel much better and more energetic. This is especially true if your red blood cells have gotten low (anemic). The transfusion raises the level of the red blood cells which carry oxygen, and this usually causes an energy increase.  The nurse administering the transfusion will monitor you carefully for complications. HOME CARE INSTRUCTIONS  No special instructions are needed after a transfusion. You may find your energy is better. Speak with your caregiver about any limitations on activity for underlying diseases you may have. SEEK MEDICAL CARE IF:   Your condition is not improving after your transfusion.  You develop redness or irritation at the intravenous (IV) site. SEEK IMMEDIATE MEDICAL CARE IF:  Any of the following symptoms  occur over the next 12 hours:  Shaking chills.  You have a temperature by mouth above 102 F (38.9 C), not controlled by medicine.  Chest, back, or muscle pain.  People around you feel you are not acting correctly or are confused.  Shortness of breath or difficulty breathing.  Dizziness and fainting.  You get a rash or develop  hives.  You have a decrease in urine output.  Your urine turns a dark color or changes to pink, red, or brown. Any of the following symptoms occur over the next 10 days:  You have a temperature by mouth above 102 F (38.9 C), not controlled by medicine.  Shortness of breath.  Weakness after normal activity.  The white part of the eye turns yellow (jaundice).  You have a decrease in the amount of urine or are urinating less often.  Your urine turns a dark color or changes to pink, red, or brown. Document Released: 03/27/2000 Document Revised: 06/22/2011 Document Reviewed: 11/14/2007 ExitCare Patient Information 2014 [place].  _______________________________________________________________________  Incentive Spirometer  An incentive spirometer is a tool that can help keep your lungs clear and active. This tool measures how well you are filling your lungs with each breath. Taking long deep breaths may help reverse or decrease the chance of developing breathing (pulmonary) problems (especially infection) following:  A long period of time when you are unable to move or be active. BEFORE THE PROCEDURE   If the spirometer includes an indicator to show your best effort, your nurse or respiratory therapist will set it to a desired goal.  If possible, sit up straight or lean slightly forward. Try not to slouch.  Hold the incentive spirometer in an upright position. INSTRUCTIONS FOR USE  1. Sit on the edge of your bed if possible, or sit up as far as you can in bed or on a chair. 2. Hold the incentive spirometer in an upright  position. 3. Breathe out normally. 4. Place the mouthpiece in your mouth and seal your lips tightly around it. 5. Breathe in slowly and as deeply as possible, raising the piston or the ball toward the top of the column. 6. Hold your breath for 3-5 seconds or for as long as possible. Allow the piston or ball to fall to the bottom of the column. 7. Remove the mouthpiece from your mouth and breathe out normally. 8. Rest for a few seconds and repeat Steps 1 through 7 at least 10 times every 1-2 hours when you are awake. Take your time and take a few normal breaths between deep breaths. 9. The spirometer may include an indicator to show your best effort. Use the indicator as a goal to work toward during each repetition. 10. After each set of 10 deep breaths, practice coughing to be sure your lungs are clear. If you have an incision (the cut made at the time of surgery), support your incision when coughing by placing a pillow or rolled up towels firmly against it. Once you are able to get out of bed, walk around indoors and cough well. You may stop using the incentive spirometer when instructed by your caregiver.  RISKS AND COMPLICATIONS  Take your time so you do not get dizzy or light-headed.  If you are in pain, you may need to take or ask for pain medication before doing incentive spirometry. It is harder to take a deep breath if you are having pain. AFTER USE  Rest and breathe slowly and easily.  It can be helpful to keep track of a log of your progress. Your caregiver can provide you with a simple table to help with this. If you are using the spirometer at home, follow these instructions: [place] IF:   You are having difficultly using the spirometer.  You have trouble using the spirometer as often as instructed.  Your  pain medication is not giving enough relief while using the spirometer.  You develop fever of 100.5 F (38.1 C) or higher. SEEK IMMEDIATE MEDICAL CARE IF:    You cough up bloody sputum that had not been present before.  You develop fever of 102 F (38.9 C) or greater.  You develop worsening pain at or near the incision site. MAKE SURE YOU:   Understand these instructions.  Will watch your condition.  Will get help right away if you are not doing well or get worse. Document Released: 08/10/2006 Document Revised: 06/22/2011 Document Reviewed: 10/11/2006 [facility] Patient Information 2014 [place], [place].   ________________________________________________________________________

## 2018-03-17 ENCOUNTER — Encounter (HOSPITAL_COMMUNITY): Payer: Self-pay

## 2018-03-17 ENCOUNTER — Other Ambulatory Visit: Payer: Self-pay

## 2018-03-17 ENCOUNTER — Encounter (HOSPITAL_COMMUNITY)
Admission: RE | Admit: 2018-03-17 | Discharge: 2018-03-17 | Disposition: A | Discharge status: Home / Self Care | Payer: Medicare Other | Source: Ambulatory Visit | Attending: Gynecologic Oncology | Admitting: Gynecologic Oncology

## 2018-03-17 DIAGNOSIS — Z01812 Encounter for preprocedural laboratory examination: Secondary | ICD-10-CM | POA: Diagnosis present

## 2018-03-17 DIAGNOSIS — N839 Noninflammatory disorder of ovary, fallopian tube and broad ligament, unspecified: Secondary | ICD-10-CM | POA: Insufficient documentation

## 2018-03-17 HISTORY — DX: Nausea with vomiting, unspecified: R11.2

## 2018-03-17 HISTORY — DX: Other specified postprocedural states: Z98.890

## 2018-03-17 LAB — CBC
HEMATOCRIT: 44.8 % (ref 36.0–46.0)
Hemoglobin: 14.1 g/dL (ref 12.0–15.0)
MCH: 29.8 pg (ref 26.0–34.0)
MCHC: 31.5 g/dL (ref 30.0–36.0)
MCV: 94.7 fL (ref 80.0–100.0)
Platelets: 248 10*3/uL (ref 150–400)
RBC: 4.73 MIL/uL (ref 3.87–5.11)
RDW: 13.7 % (ref 11.5–15.5)
WBC: 6.6 10*3/uL (ref 4.0–10.5)
nRBC: 0 % (ref 0.0–0.2)

## 2018-03-17 LAB — ABO/RH: ABO/RH(D): A POS

## 2018-03-17 LAB — COMPREHENSIVE METABOLIC PANEL
ALK PHOS: 42 U/L (ref 38–126)
ALT: 19 U/L (ref 0–44)
AST: 20 U/L (ref 15–41)
Albumin: 4.3 g/dL (ref 3.5–5.0)
Anion gap: 10 (ref 5–15)
BILIRUBIN TOTAL: 0.5 mg/dL (ref 0.3–1.2)
BUN: 30 mg/dL — AB (ref 8–23)
CALCIUM: 9.1 mg/dL (ref 8.9–10.3)
CO2: 23 mmol/L (ref 22–32)
CREATININE: 0.84 mg/dL (ref 0.44–1.00)
Chloride: 106 mmol/L (ref 98–111)
GFR calc Af Amer: >60 mL/min (ref >60)
Glucose, Bld: 105 mg/dL — ABNORMAL HIGH (ref 70–99)
Potassium: 4.5 mmol/L (ref 3.5–5.1)
Sodium: 139 mmol/L (ref 135–145)
TOTAL PROTEIN: 6.9 g/dL (ref 6.5–8.1)

## 2018-03-17 NOTE — Progress Notes (Signed)
UA done at preop. Lost in lab . Called Melissa Cross,NP she gave orders to get UA morning of surgery.Order in [place]

## 2018-03-22 ENCOUNTER — Ambulatory Visit (HOSPITAL_COMMUNITY): Payer: Medicare Other | Admitting: Registered Nurse

## 2018-03-22 ENCOUNTER — Ambulatory Visit (HOSPITAL_COMMUNITY)
Admission: RE | Admit: 2018-03-22 | Discharge: 2018-03-22 | Disposition: A | Discharge status: Home / Self Care | Payer: Medicare Other | Source: Ambulatory Visit | Attending: Gynecologic Oncology | Admitting: Gynecologic Oncology

## 2018-03-22 ENCOUNTER — Encounter (HOSPITAL_COMMUNITY)
Admission: RE | Disposition: A | Discharge status: Home / Self Care | Payer: Self-pay | Source: Ambulatory Visit | Attending: Gynecologic Oncology

## 2018-03-22 ENCOUNTER — Encounter (HOSPITAL_COMMUNITY): Payer: Self-pay | Admitting: General Practice

## 2018-03-22 DIAGNOSIS — Z8041 Family history of malignant neoplasm of ovary: Secondary | ICD-10-CM | POA: Diagnosis not present

## 2018-03-22 DIAGNOSIS — M8580 Other specified disorders of bone density and structure, unspecified site: Secondary | ICD-10-CM | POA: Insufficient documentation

## 2018-03-22 DIAGNOSIS — N83201 Unspecified ovarian cyst, right side: Secondary | ICD-10-CM

## 2018-03-22 DIAGNOSIS — J45909 Unspecified asthma, uncomplicated: Secondary | ICD-10-CM | POA: Insufficient documentation

## 2018-03-22 DIAGNOSIS — Z79899 Other long term (current) drug therapy: Secondary | ICD-10-CM | POA: Diagnosis not present

## 2018-03-22 DIAGNOSIS — Z7951 Long term (current) use of inhaled steroids: Secondary | ICD-10-CM | POA: Insufficient documentation

## 2018-03-22 DIAGNOSIS — D270 Benign neoplasm of right ovary: Secondary | ICD-10-CM | POA: Insufficient documentation

## 2018-03-22 DIAGNOSIS — Z880 Allergy status to penicillin: Secondary | ICD-10-CM | POA: Insufficient documentation

## 2018-03-22 DIAGNOSIS — N838 Other noninflammatory disorders of ovary, fallopian tube and broad ligament: Secondary | ICD-10-CM | POA: Diagnosis not present

## 2018-03-22 HISTORY — PX: ROBOTIC ASSISTED BILATERAL SALPINGO OOPHERECTOMY: SHX6078

## 2018-03-22 LAB — URINALYSIS, ROUTINE W REFLEX MICROSCOPIC
Bilirubin Urine: NEGATIVE
Glucose, UA: NEGATIVE mg/dL
Hgb urine dipstick: NEGATIVE
Ketones, ur: NEGATIVE mg/dL
Leukocytes, UA: NEGATIVE
Nitrite: NEGATIVE
Protein, ur: NEGATIVE mg/dL
Specific Gravity, Urine: 1.002 — ABNORMAL LOW (ref 1.005–1.030)
pH: 7 (ref 5.0–8.0)

## 2018-03-22 LAB — TYPE AND SCREEN
ABO/RH(D): A POS
Antibody Screen: NEGATIVE

## 2018-03-22 SURGERY — SALPINGO-OOPHORECTOMY, BILATERAL, ROBOT-ASSISTED
Anesthesia: General | Laterality: Bilateral

## 2018-03-22 MED ORDER — ONDANSETRON HCL 4 MG/2ML IJ SOLN
INTRAMUSCULAR | Status: DC | PRN
  Administered 2018-03-22: 4 mg via INTRAVENOUS

## 2018-03-22 MED ORDER — MORPHINE SULFATE (PF) 4 MG/ML IV SOLN: 2.0000 mg | INTRAVENOUS | Status: DC | PRN

## 2018-03-22 MED ORDER — SCOPOLAMINE 1 MG/3DAYS TD PT72
1.0000 | MEDICATED_PATCH | TRANSDERMAL | Status: DC
  Administered 2018-03-22: 1.5 mg via TRANSDERMAL
  Filled 2018-03-22: qty 1

## 2018-03-22 MED ORDER — FENTANYL CITRATE (PF) 250 MCG/5ML IJ SOLN
INTRAMUSCULAR | Status: DC | PRN
  Administered 2018-03-22: 100 ug via INTRAVENOUS
  Administered 2018-03-22: 150 ug via INTRAVENOUS

## 2018-03-22 MED ORDER — ROCURONIUM BROMIDE 10 MG/ML (PF) SYRINGE
PREFILLED_SYRINGE | INTRAVENOUS | Status: DC | PRN
  Administered 2018-03-22: 50 mg via INTRAVENOUS

## 2018-03-22 MED ORDER — ONDANSETRON HCL 4 MG/2ML IJ SOLN
INTRAMUSCULAR | Status: AC
  Filled 2018-03-22: qty 2

## 2018-03-22 MED ORDER — LACTATED RINGERS IR SOLN
Status: DC | PRN
  Administered 2018-03-22: 1000 mL

## 2018-03-22 MED ORDER — MIDAZOLAM HCL 5 MG/5ML IJ SOLN
INTRAMUSCULAR | Status: DC | PRN
  Administered 2018-03-22: 2 mg via INTRAVENOUS

## 2018-03-22 MED ORDER — SUGAMMADEX SODIUM 200 MG/2ML IV SOLN
INTRAVENOUS | Status: DC | PRN
  Administered 2018-03-22: 200 mg via INTRAVENOUS

## 2018-03-22 MED ORDER — LIDOCAINE 2% (20 MG/ML) 5 ML SYRINGE
INTRAMUSCULAR | Status: AC
  Filled 2018-03-22: qty 5

## 2018-03-22 MED ORDER — CIPROFLOXACIN IN D5W 400 MG/200ML IV SOLN
400.0000 mg | INTRAVENOUS | Status: AC
  Administered 2018-03-22: 400 mg via INTRAVENOUS
  Filled 2018-03-22: qty 200

## 2018-03-22 MED ORDER — ROCURONIUM BROMIDE 10 MG/ML (PF) SYRINGE
PREFILLED_SYRINGE | INTRAVENOUS | Status: AC
  Filled 2018-03-22: qty 10

## 2018-03-22 MED ORDER — DEXAMETHASONE SODIUM PHOSPHATE 4 MG/ML IJ SOLN: 4.0000 mg | INTRAMUSCULAR | Status: DC

## 2018-03-22 MED ORDER — FENTANYL CITRATE (PF) 250 MCG/5ML IJ SOLN
INTRAMUSCULAR | Status: AC
  Filled 2018-03-22: qty 5

## 2018-03-22 MED ORDER — LACTATED RINGERS IV SOLN
INTRAVENOUS | Status: DC
  Administered 2018-03-22: 11:00:00 via INTRAVENOUS

## 2018-03-22 MED ORDER — SODIUM CHLORIDE 0.9% FLUSH: 3.0000 mL | INTRAVENOUS | Status: DC | PRN

## 2018-03-22 MED ORDER — FENTANYL CITRATE (PF) 100 MCG/2ML IJ SOLN
INTRAMUSCULAR | Status: AC
  Filled 2018-03-22: qty 4

## 2018-03-22 MED ORDER — BUPIVACAINE-EPINEPHRINE (PF) 0.25% -1:200000 IJ SOLN
INTRAMUSCULAR | Status: AC
  Filled 2018-03-22: qty 30

## 2018-03-22 MED ORDER — ACETAMINOPHEN 650 MG RE SUPP
650.0000 mg | RECTAL | Status: DC | PRN
  Filled 2018-03-22: qty 1

## 2018-03-22 MED ORDER — DEXAMETHASONE SODIUM PHOSPHATE 10 MG/ML IJ SOLN
INTRAMUSCULAR | Status: DC | PRN
  Administered 2018-03-22: 10 mg via INTRAVENOUS

## 2018-03-22 MED ORDER — ACETAMINOPHEN 500 MG PO TABS
1000.0000 mg | ORAL_TABLET | ORAL | Status: AC
  Administered 2018-03-22: 1000 mg via ORAL
  Filled 2018-03-22: qty 2

## 2018-03-22 MED ORDER — EPHEDRINE SULFATE-NACL 50-0.9 MG/10ML-% IV SOSY
PREFILLED_SYRINGE | INTRAVENOUS | Status: DC | PRN
  Administered 2018-03-22: 20 mg via INTRAVENOUS

## 2018-03-22 MED ORDER — PROPOFOL 10 MG/ML IV BOLUS
INTRAVENOUS | Status: AC
  Filled 2018-03-22: qty 20

## 2018-03-22 MED ORDER — CLINDAMYCIN PHOSPHATE 900 MG/50ML IV SOLN
900.0000 mg | INTRAVENOUS | Status: AC
  Administered 2018-03-22: 900 mg via INTRAVENOUS
  Filled 2018-03-22: qty 50

## 2018-03-22 MED ORDER — DEXAMETHASONE SODIUM PHOSPHATE 10 MG/ML IJ SOLN
INTRAMUSCULAR | Status: AC
  Filled 2018-03-22: qty 1

## 2018-03-22 MED ORDER — ACETAMINOPHEN 325 MG PO TABS: 650.0000 mg | ORAL_TABLET | ORAL | Status: DC | PRN

## 2018-03-22 MED ORDER — SODIUM CHLORIDE 0.9 % IV SOLN: 250.0000 mL | INTRAVENOUS | Status: DC | PRN

## 2018-03-22 MED ORDER — MIDAZOLAM HCL 2 MG/2ML IJ SOLN
INTRAMUSCULAR | Status: AC
  Filled 2018-03-22: qty 2

## 2018-03-22 MED ORDER — OXYCODONE HCL 5 MG PO TABS: 5.0000 mg | ORAL_TABLET | ORAL | Status: DC | PRN

## 2018-03-22 MED ORDER — FENTANYL CITRATE (PF) 100 MCG/2ML IJ SOLN
25.0000 ug | INTRAMUSCULAR | Status: DC | PRN
  Administered 2018-03-22 (×2): 50 ug via INTRAVENOUS

## 2018-03-22 MED ORDER — LIDOCAINE 2% (20 MG/ML) 5 ML SYRINGE
INTRAMUSCULAR | Status: DC | PRN
  Administered 2018-03-22: 100 mg via INTRAVENOUS

## 2018-03-22 MED ORDER — BUPIVACAINE-EPINEPHRINE (PF) 0.25% -1:200000 IJ SOLN
INTRAMUSCULAR | Status: DC | PRN
  Administered 2018-03-22: 30 mL via PERINEURAL

## 2018-03-22 MED ORDER — PROPOFOL 10 MG/ML IV BOLUS
INTRAVENOUS | Status: DC | PRN
  Administered 2018-03-22: 160 mg via INTRAVENOUS
  Administered 2018-03-22: 40 mg via INTRAVENOUS

## 2018-03-22 MED ORDER — SUGAMMADEX SODIUM 200 MG/2ML IV SOLN
INTRAVENOUS | Status: AC
  Filled 2018-03-22: qty 2

## 2018-03-22 MED ORDER — OXYCODONE-ACETAMINOPHEN 5-325 MG PO TABS: 1.0000 | ORAL_TABLET | ORAL | 0 refills | Status: AC | PRN

## 2018-03-22 MED ORDER — SODIUM CHLORIDE 0.9% FLUSH: 3.0000 mL | Freq: Two times a day (BID) | INTRAVENOUS | Status: DC

## 2018-03-22 MED ORDER — GABAPENTIN 300 MG PO CAPS
300.0000 mg | ORAL_CAPSULE | ORAL | Status: AC
  Administered 2018-03-22: 300 mg via ORAL
  Filled 2018-03-22: qty 1

## 2018-03-22 SURGICAL SUPPLY — 48 items
APPLICATOR SURGIFLO ENDO (HEMOSTASIS) IMPLANT
BAG LAPAROSCOPIC 12 15 PORT 16 (BASKET) ×1 IMPLANT
BAG RETRIEVAL 12/15 (BASKET) ×2
COVER BACK TABLE 60X90IN (DRAPES) ×2 IMPLANT
COVER TIP SHEARS 8 DVNC (MISCELLANEOUS) ×1 IMPLANT
COVER TIP SHEARS 8MM DA VINCI (MISCELLANEOUS) ×1
COVER WAND RF STERILE (DRAPES) IMPLANT
DERMABOND ADVANCED (GAUZE/BANDAGES/DRESSINGS) ×1
DERMABOND ADVANCED .7 DNX12 (GAUZE/BANDAGES/DRESSINGS) ×1 IMPLANT
DRAPE ARM DVNC X/XI (DISPOSABLE) ×4 IMPLANT
DRAPE COLUMN DVNC XI (DISPOSABLE) ×1 IMPLANT
DRAPE DA VINCI XI ARM (DISPOSABLE) ×4
DRAPE DA VINCI XI COLUMN (DISPOSABLE) ×1
DRAPE SHEET LG 3/4 BI-LAMINATE (DRAPES) ×2 IMPLANT
DRAPE SURG IRRIG POUCH 19X23 (DRAPES) ×2 IMPLANT
ELECT REM PT RETURN 15FT ADLT (MISCELLANEOUS) ×2 IMPLANT
GLOVE BIO SURGEON STRL SZ 6 (GLOVE) ×8 IMPLANT
GLOVE BIO SURGEON STRL SZ 6.5 (GLOVE) ×4 IMPLANT
GOWN STRL REUS W/ TWL LRG LVL3 (GOWN DISPOSABLE) ×2 IMPLANT
GOWN STRL REUS W/TWL LRG LVL3 (GOWN DISPOSABLE) ×2
HOLDER FOLEY CATH W/STRAP (MISCELLANEOUS) ×2 IMPLANT
IRRIG SUCT STRYKERFLOW 2 WTIP (MISCELLANEOUS) ×2
IRRIGATION SUCT STRKRFLW 2 WTP (MISCELLANEOUS) ×1 IMPLANT
KIT PROCEDURE DA VINCI SI (MISCELLANEOUS)
KIT PROCEDURE DVNC SI (MISCELLANEOUS) IMPLANT
MANIPULATOR UTERINE 4.5 ZUMI (MISCELLANEOUS) ×2 IMPLANT
NEEDLE SPNL 18GX3.5 QUINCKE PK (NEEDLE) IMPLANT
OBTURATOR OPTICAL STANDARD 8MM (TROCAR) ×1
OBTURATOR OPTICAL STND 8 DVNC (TROCAR) ×1
OBTURATOR OPTICALSTD 8 DVNC (TROCAR) ×1 IMPLANT
PACK ROBOT GYN CUSTOM WL (TRAY / TRAY PROCEDURE) ×2 IMPLANT
PAD POSITIONING PINK XL (MISCELLANEOUS) ×2 IMPLANT
PORT ACCESS TROCAR AIRSEAL 12 (TROCAR) ×1 IMPLANT
PORT ACCESS TROCAR AIRSEAL 5M (TROCAR) ×1
POUCH SPECIMEN RETRIEVAL 10MM (ENDOMECHANICALS) ×4 IMPLANT
SEAL CANN UNIV 5-8 DVNC XI (MISCELLANEOUS) ×4 IMPLANT
SEAL XI 5MM-8MM UNIVERSAL (MISCELLANEOUS) ×4
SET TRI-LUMEN FLTR TB AIRSEAL (TUBING) ×2 IMPLANT
SURGIFLO W/THROMBIN 8M KIT (HEMOSTASIS) IMPLANT
SUT MNCRL AB 4-0 PS2 18 (SUTURE) ×2 IMPLANT
SUT VIC AB 0 CT1 27 (SUTURE)
SUT VIC AB 0 CT1 27XBRD ANTBC (SUTURE) IMPLANT
SYR 10ML LL (SYRINGE) IMPLANT
TOWEL OR NON WOVEN STRL DISP B (DISPOSABLE) ×2 IMPLANT
TRAP SPECIMEN MUCOUS 40CC (MISCELLANEOUS) ×2 IMPLANT
TRAY FOLEY MTR SLVR 16FR STAT (SET/KITS/TRAYS/PACK) IMPLANT
UNDERPAD 30X30 (UNDERPADS AND DIAPERS) ×2 IMPLANT
WATER STERILE IRR 1000ML POUR (IV SOLUTION) ×2 IMPLANT

## 2018-03-22 NOTE — Transfer of Care (Signed)
Immediate Anesthesia Transfer of Care Note  Patient: Yesenia Welch  Procedure(s) Performed: XI ROBOTIC ASSISTED BILATERAL SALPINGO OOPHORECTOMY WITH PERITONEAL WASHINGS (Bilateral )  Patient Location: PACU  Anesthesia Type:General  Level of Consciousness: sedated  Airway & Oxygen Therapy: Patient Spontanous Breathing and Patient connected to face mask oxygen  Post-op Assessment: Report given to RN and Post -op Vital signs reviewed and stable  Post vital signs: Reviewed and stable  Last Vitals:  Vitals Value Taken Time  BP    Temp    Pulse    Resp    SpO2      Last Pain:  Vitals:   03/22/18 1109  TempSrc:   PainSc: 3          Complications: No apparent anesthesia complications

## 2018-03-22 NOTE — Anesthesia Procedure Notes (Signed)
Procedure Name: Intubation Date/Time: 03/22/2018 3:18 PM Performed by: Talbot Grumbling, CRNA Pre-anesthesia Checklist: Patient identified, Emergency Drugs available, Suction available and Patient being monitored Patient Re-evaluated:Patient Re-evaluated prior to induction Oxygen Delivery Method: Circle system utilized Preoxygenation: Pre-oxygenation with 100% oxygen Induction Type: IV induction Ventilation: Mask ventilation without difficulty Laryngoscope Size: Mac and 3 Grade View: Grade I Tube type: Oral Tube size: 7.0 mm Number of attempts: 1 Airway Equipment and Method: Stylet Placement Confirmation: ETT inserted through vocal cords under direct vision,  positive ETCO2 and breath sounds checked- equal and bilateral Secured at: 21 cm Tube secured with: Tape Dental Injury: Teeth and Oropharynx as per pre-operative assessment

## 2018-03-22 NOTE — Op Note (Signed)
OPERATIVE NOTE  Date: 03/22/18  Preoperative Diagnosis: right ovarian mass   Postoperative Diagnosis:  same  Procedure(s) Performed: Robotic-assisted laparoscopic bilateral salpingo-oophorectomy  Surgeon: Everitt Amber, M.D.  Assistant Surgeon: Lahoma Crocker M.D. (an MD assistant was necessary for tissue manipulation, management of robotic instrumentation, retraction and positioning due to the complexity of the case and hospital policies).   Anesthesia: Gen. endotracheal.  Specimens: Bilateral ovaries, fallopian tubes, pelvic washings  Estimated Blood Loss: 10 mL. Blood Replacement: None  Complications: none  Indication for Procedure:  Right ovarian cyst  Operative Findings: 10cm right ovarian cyst, normal left tube and ovary, normal uterus.  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 She is placed in a dorsolithotomy position and cervical acromial pad was placed. The arms were tucked with care taken to pad the olecranon process. And prepped and draped in usual sterile fashion. A uterine manipulator (zumi) was placed vaginally. A 53mm incision was made in the left upper quadrant palmer's point and a 5 mm Optiview trocar used to enter the abdomen under direct visualization. With entry into the abdomen and then maintenance of 15 mm of mercury the patient was placed in Trendelenburg position. An incision was made in the umbilicus and a 31DV trochar was placed through this site. Two incisions were made lateral to the umbilical incision in the left and right abdomen measuring 22mm. These incisions were made approximately 10 cm lateral to the umbilical incision. 8 mm robotic trochars were inserted. The robot was docked.  The abdomen was inspected as was the pelvis.  Pelvic washings were obtained. An incision was made on the right pelvic side wall  peritoneum parallel to the IP ligament and the retroperitoneal space entered. The right ureter was identified and the para-rectal space was developed. A window was created in the right broad ligament above the ureter. The right infundibulopelvic vessels were skeletonized cauterized and transected. The utero-ovarian ligaments similarly were cauterized and transected. Specimen was placed in an Endo Catch bag.  In a similar manner the left peritoneum and the side wall was incised, and the retroperitoneal space entered. The left ureter was identified and the left pararectal space was developed. The utero-ovarian ligament was skeletonized cauterized and transected. The left utero-ovarian ligaments were cauterized and transected in the left adnexa was placed in an Endo Catch bag.  The abdomen was copiously irrigated and drained and all operative sites inspected and hemostasis was assured. The uterine manipulator was removed and the backwall of the uterus was carefully inspected where the uterine manipulator had penetrated the back wall of the uterine fundus during manipulation. This was hemostatic.   The robot was undocked. The contents of the left Endo Catch bag were first aspirated and then morcellated to facilitate removal from the abdominal cavity through the left upper quadrant incision. In a similar fashion the contents of the right Endo Catch bag or morcellated to facilitate removal from the abdominal cavity.  The ports were all remove. The fascial closure at the umbilical incision and left upper quadrant port was made with 0 Vicryl.  All incisions were closed with a running subcuticular Monocryl suture. Dermabond was applied. Sponge, lap and needle counts were correct x 3.    The patient had sequential compression devices for VTE prophylaxis.         Disposition: PACU -stable         Condition: stable  Everitt Amber  Chrys Racer, MD

## 2018-03-22 NOTE — Anesthesia Postprocedure Evaluation (Signed)
Anesthesia Post Note  Patient: Yesenia Welch  Procedure(s) Performed: XI ROBOTIC ASSISTED BILATERAL SALPINGO OOPHORECTOMY WITH PERITONEAL WASHINGS (Bilateral )     Patient location during evaluation: PACU Anesthesia Type: General Level of consciousness: awake Pain management: pain level controlled Vital Signs Assessment: post-procedure vital signs reviewed and stable Respiratory status: spontaneous breathing Cardiovascular status: stable Postop Assessment: no headache Anesthetic complications: no    Last Vitals:  Vitals:   03/22/18 1721 03/22/18 1745  BP: 135/75 (!) 149/81  Pulse: 72 74  Resp: 18 18  Temp: (!) 36.1 C   SpO2: 98% 100%    Last Pain:  Vitals:   03/22/18 1745  TempSrc:   PainSc: 0-No pain                 Ailynn Gow

## 2018-03-22 NOTE — Interval H&P Note (Signed)
History and Physical Interval Note:  03/22/2018 2:32 PM  Yesenia Welch  has presented today for surgery, with the diagnosis of OVARIAN MASS  The various methods of treatment have been discussed with the patient and family. After consideration of risks, benefits and other options for treatment, the patient has consented to  Procedure(s): XI ROBOTIC ASSISTED BILATERAL SALPINGO OOPHORECTOMY, POSSIBLE STAGING (Bilateral) as a surgical intervention .  The patient's history has been reviewed, patient examined, no change in status, stable for surgery.  I have reviewed the patient's chart and labs.  Questions were answered to the patient's satisfaction.     [name]

## 2018-03-22 NOTE — Anesthesia Preprocedure Evaluation (Addendum)
Anesthesia Evaluation  Patient identified by MRN, date of birth, ID band Patient awake    Reviewed: Allergy & Precautions, NPO status , Patient's Chart, lab work & pertinent test results  History of Anesthesia Complications (+) PONV  Airway Mallampati: II  TM Distance: >3 FB     Dental   Pulmonary asthma ,    breath sounds clear to auscultation       Cardiovascular negative cardio ROS   Rhythm:Regular Rate:Normal     Neuro/Psych    GI/Hepatic negative GI ROS, Neg liver ROS,   Endo/Other  negative endocrine ROS  Renal/GU negative Renal ROS     Musculoskeletal   Abdominal   Peds  Hematology   Anesthesia Other Findings   Reproductive/Obstetrics                             Anesthesia Physical Anesthesia Plan  ASA: II  Anesthesia Plan: General   Post-op Pain Management:    Induction: Intravenous  PONV Risk Score and Plan: 4 or greater and Ondansetron, Dexamethasone, Midazolam and Treatment may vary due to age or medical condition  Airway Management Planned: Oral ETT  Additional Equipment:   Intra-op Plan:   Post-operative Plan: Possible Post-op intubation/ventilation  Informed Consent: I have reviewed the patients History and Physical, chart, labs and discussed the procedure including the risks, benefits and alternatives for the proposed anesthesia with the patient or authorized representative who has indicated his/her understanding and acceptance.   Dental advisory given  Plan Discussed with: CRNA  Anesthesia Plan Comments:         Anesthesia Quick Evaluation

## 2018-03-22 NOTE — Discharge Instructions (Signed)
03/22/2018  Return to work: 4 weeks  Activity: 1. Be up and out of the bed during the day.  Take a nap if needed.  You may walk up steps but be careful and use the hand rail.  Stair climbing will tire you more than you think, you may need to stop part way and rest.   2. No lifting or straining for 4 weeks.  3. No driving for 1 weeks.  Do Not drive if you are taking narcotic pain medicine.  4. Shower daily.  Use soap and water on your incision and pat dry; don't rub.   5. No sexual activity and nothing in the vagina for 2 weeks.  Medications:  - Take ibuprofen and tylenol first line for pain control. Take these regularly (every 6 hours) to decrease the build up of pain.  - If necessary, for severe pain not relieved by ibuprofen, take percocet.  - While taking percocet you should take sennakot every night to reduce the likelihood of constipation. If this causes diarrhea, stop its use.  Diet: 1. Low sodium Heart Healthy Diet is recommended.  2. It is safe to use a laxative if you have difficulty moving your bowels.   Wound Care: 1. Keep clean and dry.  Shower daily.  Reasons to call the Doctor:   Fever - Oral temperature greater than 100.4 degrees Fahrenheit  Foul-smelling vaginal discharge  Difficulty urinating  Nausea and vomiting  Increased pain at the site of the incision that is unrelieved with pain medicine.  Difficulty breathing with or without chest pain  New calf pain especially if only on one side  Sudden, continuing increased vaginal bleeding with or without clots.   Follow-up: 1. See Yesenia Welch in 4 weeks.  Contacts: For questions or concerns you should contact:  Dr. Everitt Welch at [phone] After hours and on week-ends call [phone] and ask to speak to the physician on call for Gynecologic Oncology   Bilateral Salpingo-Oophorectomy, Care After This sheet gives you information about how to care for yourself after your procedure. Your health  care provider may also give you more specific instructions. If you have problems or questions, contact your health care provider. What can I expect after the procedure? After the procedure, it is common to have:  Abdominal pain.  Some occasional vaginal bleeding (spotting).  Tiredness.  Symptoms of menopause, such as hot flashes, night sweats, or mood swings.  Follow these instructions at home: Incision care  Keep your incision area and your bandage (dressing) clean and dry.  Follow instructions from your health care provider about how to take care of your incision. Make sure you: ? Wash your hands with soap and water before you change your dressing. If soap and water are not available, use hand sanitizer. ? Change your dressing as told by your health care provider. ? Leave stitches (sutures), staples, skin glue, or adhesive strips in place. These skin closures may need to stay in place for 2 weeks or longer. If adhesive strip edges start to loosen and curl up, you may trim the loose edges. Do not remove adhesive strips completely unless your health care provider tells you to do that.  Check your incision area every day for signs of infection. Check for: ? Redness, swelling, or pain. ? Fluid or blood. ? Warmth. ? Pus or a bad smell. Activity  Do not drive or use heavy machinery while taking prescription pain medicine.  Do not drive for 24 hours  if you received a medicine to help you relax (sedative) during your procedure.  Take frequent, short walks throughout the day. Rest when you get tired. Ask your health care provider what activities are safe for you.  Avoid activity that requires great effort. Also, avoid heavy lifting. Do not lift anything that is heavier than 10 lbs. (4.5 kg), or the limit that your health care provider tells you, until he or she says that it is safe to do so.  Do not douche, use tampons, or have sex until your health care provider approves. General  instructions  To prevent or treat constipation while you are taking prescription pain medicine, your health care provider may recommend that you: ? Drink enough fluid to keep your urine clear or pale yellow. ? Take over-the-counter or prescription medicines. ? Eat foods that are high in fiber, such as fresh fruits and vegetables, whole grains, and beans. ? Limit foods that are high in fat and processed sugars, such as fried and sweet foods.  Take over-the-counter and prescription medicines only as told by your health care provider.  Do not take baths, swim, or use a hot tub until your health care provider approves. Ask your health care provider if you can take showers. You may only be allowed to take sponge baths for bathing.  Wear compression stockings as told by your health care provider. These stockings help to prevent blood clots and reduce swelling in your legs.  Keep all follow-up visits as told by your health care provider. This is important. Contact a health care provider if:  You have pain when you urinate.  You have pus or a bad smelling discharge coming from your vagina.  You have redness, swelling, or pain around your incision.  You have fluid or blood coming from your incision.  Your incision feels warm to the touch.  You have pus or a bad smell coming from your incision.  You have a fever.  Your incision starts to break open.  You have pain in the abdomen, and it gets worse or does not get better when you take medicine.  You develop a rash.  You develop nausea and vomiting.  You feel lightheaded. Get help right away if:  You develop pain in your chest or leg.  You become short of breath.  You faint.  You have increased bleeding from your vagina. Summary  After the procedure, it is common to have pain, bleeding in the vagina, and symptoms of menopause.  Follow instructions from your health care provider about how to take care of your  incision.  Follow instructions from your health care provider about activities and restrictions.  Check your incision every day for signs of infection and report any symptoms to your health care provider. This information is not intended to replace advice given to you by your health care provider. Make sure you discuss any questions you have with your health care provider. Document Released: 03/30/2005 Document Revised: 05/04/2016 Document Reviewed: 05/04/2016 Elsevier Interactive Patient Education  [employer].

## 2018-03-23 ENCOUNTER — Telehealth: Payer: Self-pay | Admitting: *Deleted

## 2018-03-23 NOTE — Telephone Encounter (Signed)
Patient called and changed her post op appt from Dr. Fermin Schwab to Dr. Denman George

## 2018-03-24 ENCOUNTER — Encounter (HOSPITAL_COMMUNITY): Payer: Self-pay | Admitting: Gynecologic Oncology

## 2018-03-24 ENCOUNTER — Telehealth: Payer: Self-pay

## 2018-03-24 NOTE — Telephone Encounter (Signed)
Yesenia Welch states that she is doing well. Her lower abdomen is sore. She is using tylenol and ibuprofen today.  Told her that she could use 1/2 to 1 percocet if needed to help sleep or if pain > 5-6/10.  She should notice an improvement in her discomfort in another 2-3 days. Her incisions look great. She has not moved her bowels yet. She began using Miralax last evening.  Told her that she can take 1 capful bid as needed.  She can add 1-2 Senokot-S tablets at hs.  She feels she will feel less discomfort once bowels move. She is eating and drinking fluids very well.  Ambulating around the house as well.  Told Yesenia Welch that the pathology showed no evidence of cancer per Melissa Cross,NP.  Pt to call the office if she has any questions or concerns prior to her post-op appointment on 04-20-18 with Dr. Denman George.

## 2018-04-20 ENCOUNTER — Inpatient Hospital Stay: Payer: Medicare Other | Attending: Gynecologic Oncology | Admitting: Gynecologic Oncology

## 2018-04-20 ENCOUNTER — Encounter: Payer: Self-pay | Admitting: Gynecologic Oncology

## 2018-04-20 VITALS — BP 132/88 | HR 66 | Temp 98.2°F | Resp 18 | Ht 61.0 in | Wt 138.0 lb

## 2018-04-20 DIAGNOSIS — N83201 Unspecified ovarian cyst, right side: Secondary | ICD-10-CM | POA: Insufficient documentation

## 2018-04-20 DIAGNOSIS — Z90722 Acquired absence of ovaries, bilateral: Secondary | ICD-10-CM

## 2018-04-20 NOTE — Patient Instructions (Signed)
Please return to see Earnstine Regal annually for your wellness care.  You are cleared to return to all regular activities at this point.  Contact Dr Denman George at [phone] if you have questions.
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
## 2018-04-26 ENCOUNTER — Ambulatory Visit: Payer: Medicare Other | Admitting: Gynecology

## 2018-06-13 ENCOUNTER — Other Ambulatory Visit (HOSPITAL_COMMUNITY): Payer: Self-pay | Admitting: Internal Medicine

## 2018-06-13 DIAGNOSIS — Z780 Asymptomatic menopausal state: Secondary | ICD-10-CM

## 2018-06-13 DIAGNOSIS — M8580 Other specified disorders of bone density and structure, unspecified site: Secondary | ICD-10-CM

## 2018-06-24 ENCOUNTER — Other Ambulatory Visit: Payer: Self-pay

## 2018-06-24 ENCOUNTER — Ambulatory Visit (HOSPITAL_COMMUNITY)
Admission: RE | Admit: 2018-06-24 | Discharge: 2018-06-24 | Disposition: A | Discharge status: Home / Self Care | Payer: Medicare Other | Source: Ambulatory Visit | Attending: Internal Medicine | Admitting: Internal Medicine

## 2018-06-24 DIAGNOSIS — M8580 Other specified disorders of bone density and structure, unspecified site: Secondary | ICD-10-CM | POA: Insufficient documentation

## 2018-06-24 DIAGNOSIS — Z780 Asymptomatic menopausal state: Secondary | ICD-10-CM | POA: Diagnosis present

## 2018-06-27 ENCOUNTER — Other Ambulatory Visit (HOSPITAL_COMMUNITY): Payer: Medicare Other

## 2019-03-29 ENCOUNTER — Other Ambulatory Visit (HOSPITAL_COMMUNITY): Payer: Self-pay | Admitting: Internal Medicine

## 2019-03-29 DIAGNOSIS — Z1231 Encounter for screening mammogram for malignant neoplasm of breast: Secondary | ICD-10-CM

## 2019-04-17 ENCOUNTER — Other Ambulatory Visit: Payer: Self-pay

## 2019-04-17 ENCOUNTER — Ambulatory Visit (HOSPITAL_COMMUNITY)
Admission: RE | Admit: 2019-04-17 | Discharge: 2019-04-17 | Disposition: A | Discharge status: Home / Self Care | Payer: Medicare PPO | Source: Ambulatory Visit | Attending: Internal Medicine | Admitting: Internal Medicine

## 2019-04-17 DIAGNOSIS — Z1231 Encounter for screening mammogram for malignant neoplasm of breast: Secondary | ICD-10-CM | POA: Diagnosis not present

## 2019-04-26 DIAGNOSIS — J3089 Other allergic rhinitis: Secondary | ICD-10-CM | POA: Diagnosis not present

## 2019-04-26 DIAGNOSIS — H18603 Keratoconus, unspecified, bilateral: Secondary | ICD-10-CM | POA: Diagnosis not present

## 2019-04-26 DIAGNOSIS — H2513 Age-related nuclear cataract, bilateral: Secondary | ICD-10-CM | POA: Diagnosis not present

## 2019-04-26 DIAGNOSIS — H02403 Unspecified ptosis of bilateral eyelids: Secondary | ICD-10-CM | POA: Diagnosis not present

## 2019-04-26 DIAGNOSIS — J301 Allergic rhinitis due to pollen: Secondary | ICD-10-CM | POA: Diagnosis not present

## 2019-04-26 DIAGNOSIS — J3081 Allergic rhinitis due to animal (cat) (dog) hair and dander: Secondary | ICD-10-CM | POA: Diagnosis not present

## 2019-04-28 DIAGNOSIS — N941 Unspecified dyspareunia: Secondary | ICD-10-CM | POA: Diagnosis not present

## 2019-04-28 DIAGNOSIS — Z6826 Body mass index (BMI) 26.0-26.9, adult: Secondary | ICD-10-CM | POA: Diagnosis not present

## 2019-04-28 DIAGNOSIS — Z01419 Encounter for gynecological examination (general) (routine) without abnormal findings: Secondary | ICD-10-CM | POA: Diagnosis not present

## 2019-04-28 DIAGNOSIS — N898 Other specified noninflammatory disorders of vagina: Secondary | ICD-10-CM | POA: Diagnosis not present

## 2019-05-05 DIAGNOSIS — J301 Allergic rhinitis due to pollen: Secondary | ICD-10-CM | POA: Diagnosis not present

## 2019-05-05 DIAGNOSIS — J3081 Allergic rhinitis due to animal (cat) (dog) hair and dander: Secondary | ICD-10-CM | POA: Diagnosis not present

## 2019-05-05 DIAGNOSIS — J3089 Other allergic rhinitis: Secondary | ICD-10-CM | POA: Diagnosis not present

## 2019-05-19 DIAGNOSIS — J3089 Other allergic rhinitis: Secondary | ICD-10-CM | POA: Diagnosis not present

## 2019-05-19 DIAGNOSIS — J3081 Allergic rhinitis due to animal (cat) (dog) hair and dander: Secondary | ICD-10-CM | POA: Diagnosis not present

## 2019-05-19 DIAGNOSIS — J301 Allergic rhinitis due to pollen: Secondary | ICD-10-CM | POA: Diagnosis not present

## 2019-05-26 DIAGNOSIS — J3089 Other allergic rhinitis: Secondary | ICD-10-CM | POA: Diagnosis not present

## 2019-05-26 DIAGNOSIS — J3081 Allergic rhinitis due to animal (cat) (dog) hair and dander: Secondary | ICD-10-CM | POA: Diagnosis not present

## 2019-05-26 DIAGNOSIS — J301 Allergic rhinitis due to pollen: Secondary | ICD-10-CM | POA: Diagnosis not present

## 2019-05-30 DIAGNOSIS — L819 Disorder of pigmentation, unspecified: Secondary | ICD-10-CM | POA: Diagnosis not present

## 2019-05-30 DIAGNOSIS — L570 Actinic keratosis: Secondary | ICD-10-CM | POA: Diagnosis not present

## 2019-05-30 DIAGNOSIS — D1801 Hemangioma of skin and subcutaneous tissue: Secondary | ICD-10-CM | POA: Diagnosis not present

## 2019-05-30 DIAGNOSIS — D225 Melanocytic nevi of trunk: Secondary | ICD-10-CM | POA: Diagnosis not present

## 2019-05-30 DIAGNOSIS — D485 Neoplasm of uncertain behavior of skin: Secondary | ICD-10-CM | POA: Diagnosis not present

## 2019-06-02 DIAGNOSIS — J3089 Other allergic rhinitis: Secondary | ICD-10-CM | POA: Diagnosis not present

## 2019-06-02 DIAGNOSIS — J3081 Allergic rhinitis due to animal (cat) (dog) hair and dander: Secondary | ICD-10-CM | POA: Diagnosis not present

## 2019-06-02 DIAGNOSIS — J301 Allergic rhinitis due to pollen: Secondary | ICD-10-CM | POA: Diagnosis not present

## 2019-06-09 DIAGNOSIS — J3081 Allergic rhinitis due to animal (cat) (dog) hair and dander: Secondary | ICD-10-CM | POA: Diagnosis not present

## 2019-06-09 DIAGNOSIS — J3089 Other allergic rhinitis: Secondary | ICD-10-CM | POA: Diagnosis not present

## 2019-06-09 DIAGNOSIS — J301 Allergic rhinitis due to pollen: Secondary | ICD-10-CM | POA: Diagnosis not present

## 2019-06-16 DIAGNOSIS — J301 Allergic rhinitis due to pollen: Secondary | ICD-10-CM | POA: Diagnosis not present

## 2019-06-16 DIAGNOSIS — J3089 Other allergic rhinitis: Secondary | ICD-10-CM | POA: Diagnosis not present

## 2019-06-16 DIAGNOSIS — J3081 Allergic rhinitis due to animal (cat) (dog) hair and dander: Secondary | ICD-10-CM | POA: Diagnosis not present

## 2019-06-19 DIAGNOSIS — J4522 Mild intermittent asthma with status asthmaticus: Secondary | ICD-10-CM | POA: Diagnosis not present

## 2019-06-19 DIAGNOSIS — E785 Hyperlipidemia, unspecified: Secondary | ICD-10-CM | POA: Diagnosis not present

## 2019-06-19 DIAGNOSIS — Z79899 Other long term (current) drug therapy: Secondary | ICD-10-CM | POA: Diagnosis not present

## 2019-06-22 DIAGNOSIS — L988 Other specified disorders of the skin and subcutaneous tissue: Secondary | ICD-10-CM | POA: Diagnosis not present

## 2019-06-22 DIAGNOSIS — D485 Neoplasm of uncertain behavior of skin: Secondary | ICD-10-CM | POA: Diagnosis not present

## 2019-06-23 DIAGNOSIS — J301 Allergic rhinitis due to pollen: Secondary | ICD-10-CM | POA: Diagnosis not present

## 2019-06-23 DIAGNOSIS — J3089 Other allergic rhinitis: Secondary | ICD-10-CM | POA: Diagnosis not present

## 2019-06-23 DIAGNOSIS — J3081 Allergic rhinitis due to animal (cat) (dog) hair and dander: Secondary | ICD-10-CM | POA: Diagnosis not present

## 2019-06-30 DIAGNOSIS — J301 Allergic rhinitis due to pollen: Secondary | ICD-10-CM | POA: Diagnosis not present

## 2019-06-30 DIAGNOSIS — J3089 Other allergic rhinitis: Secondary | ICD-10-CM | POA: Diagnosis not present

## 2019-06-30 DIAGNOSIS — J3081 Allergic rhinitis due to animal (cat) (dog) hair and dander: Secondary | ICD-10-CM | POA: Diagnosis not present

## 2019-07-07 DIAGNOSIS — J3081 Allergic rhinitis due to animal (cat) (dog) hair and dander: Secondary | ICD-10-CM | POA: Diagnosis not present

## 2019-07-07 DIAGNOSIS — J3089 Other allergic rhinitis: Secondary | ICD-10-CM | POA: Diagnosis not present

## 2019-07-07 DIAGNOSIS — J301 Allergic rhinitis due to pollen: Secondary | ICD-10-CM | POA: Diagnosis not present

## 2019-07-20 DIAGNOSIS — J301 Allergic rhinitis due to pollen: Secondary | ICD-10-CM | POA: Diagnosis not present

## 2019-07-28 DIAGNOSIS — J3081 Allergic rhinitis due to animal (cat) (dog) hair and dander: Secondary | ICD-10-CM | POA: Diagnosis not present

## 2019-07-28 DIAGNOSIS — J3089 Other allergic rhinitis: Secondary | ICD-10-CM | POA: Diagnosis not present

## 2019-07-28 DIAGNOSIS — J301 Allergic rhinitis due to pollen: Secondary | ICD-10-CM | POA: Diagnosis not present

## 2019-08-04 DIAGNOSIS — J3081 Allergic rhinitis due to animal (cat) (dog) hair and dander: Secondary | ICD-10-CM | POA: Diagnosis not present

## 2019-08-04 DIAGNOSIS — J3089 Other allergic rhinitis: Secondary | ICD-10-CM | POA: Diagnosis not present

## 2019-08-04 DIAGNOSIS — J301 Allergic rhinitis due to pollen: Secondary | ICD-10-CM | POA: Diagnosis not present

## 2019-08-10 DIAGNOSIS — J301 Allergic rhinitis due to pollen: Secondary | ICD-10-CM | POA: Diagnosis not present

## 2019-08-10 DIAGNOSIS — J3081 Allergic rhinitis due to animal (cat) (dog) hair and dander: Secondary | ICD-10-CM | POA: Diagnosis not present

## 2019-08-10 DIAGNOSIS — J3089 Other allergic rhinitis: Secondary | ICD-10-CM | POA: Diagnosis not present

## 2019-08-18 DIAGNOSIS — J3081 Allergic rhinitis due to animal (cat) (dog) hair and dander: Secondary | ICD-10-CM | POA: Diagnosis not present

## 2019-08-18 DIAGNOSIS — J301 Allergic rhinitis due to pollen: Secondary | ICD-10-CM | POA: Diagnosis not present

## 2019-08-18 DIAGNOSIS — J3089 Other allergic rhinitis: Secondary | ICD-10-CM | POA: Diagnosis not present

## 2019-08-25 DIAGNOSIS — J3081 Allergic rhinitis due to animal (cat) (dog) hair and dander: Secondary | ICD-10-CM | POA: Diagnosis not present

## 2019-08-25 DIAGNOSIS — J3089 Other allergic rhinitis: Secondary | ICD-10-CM | POA: Diagnosis not present

## 2019-08-25 DIAGNOSIS — J301 Allergic rhinitis due to pollen: Secondary | ICD-10-CM | POA: Diagnosis not present

## 2019-08-29 DIAGNOSIS — J3089 Other allergic rhinitis: Secondary | ICD-10-CM | POA: Diagnosis not present

## 2019-08-29 DIAGNOSIS — J3081 Allergic rhinitis due to animal (cat) (dog) hair and dander: Secondary | ICD-10-CM | POA: Diagnosis not present

## 2019-09-01 DIAGNOSIS — J301 Allergic rhinitis due to pollen: Secondary | ICD-10-CM | POA: Diagnosis not present

## 2019-09-01 DIAGNOSIS — J3089 Other allergic rhinitis: Secondary | ICD-10-CM | POA: Diagnosis not present

## 2019-09-01 DIAGNOSIS — J3081 Allergic rhinitis due to animal (cat) (dog) hair and dander: Secondary | ICD-10-CM | POA: Diagnosis not present

## 2019-09-07 DIAGNOSIS — J3081 Allergic rhinitis due to animal (cat) (dog) hair and dander: Secondary | ICD-10-CM | POA: Diagnosis not present

## 2019-09-07 DIAGNOSIS — J301 Allergic rhinitis due to pollen: Secondary | ICD-10-CM | POA: Diagnosis not present

## 2019-09-07 DIAGNOSIS — J3089 Other allergic rhinitis: Secondary | ICD-10-CM | POA: Diagnosis not present

## 2019-09-15 DIAGNOSIS — J301 Allergic rhinitis due to pollen: Secondary | ICD-10-CM | POA: Diagnosis not present

## 2019-09-15 DIAGNOSIS — J3089 Other allergic rhinitis: Secondary | ICD-10-CM | POA: Diagnosis not present

## 2019-09-15 DIAGNOSIS — J3081 Allergic rhinitis due to animal (cat) (dog) hair and dander: Secondary | ICD-10-CM | POA: Diagnosis not present

## 2019-09-22 DIAGNOSIS — J301 Allergic rhinitis due to pollen: Secondary | ICD-10-CM | POA: Diagnosis not present

## 2019-09-22 DIAGNOSIS — J3081 Allergic rhinitis due to animal (cat) (dog) hair and dander: Secondary | ICD-10-CM | POA: Diagnosis not present

## 2019-09-22 DIAGNOSIS — J3089 Other allergic rhinitis: Secondary | ICD-10-CM | POA: Diagnosis not present

## 2019-10-06 DIAGNOSIS — J301 Allergic rhinitis due to pollen: Secondary | ICD-10-CM | POA: Diagnosis not present

## 2019-10-06 DIAGNOSIS — J3089 Other allergic rhinitis: Secondary | ICD-10-CM | POA: Diagnosis not present

## 2019-10-06 DIAGNOSIS — J3081 Allergic rhinitis due to animal (cat) (dog) hair and dander: Secondary | ICD-10-CM | POA: Diagnosis not present

## 2019-10-13 DIAGNOSIS — J3081 Allergic rhinitis due to animal (cat) (dog) hair and dander: Secondary | ICD-10-CM | POA: Diagnosis not present

## 2019-10-13 DIAGNOSIS — J301 Allergic rhinitis due to pollen: Secondary | ICD-10-CM | POA: Diagnosis not present

## 2019-10-13 DIAGNOSIS — J3089 Other allergic rhinitis: Secondary | ICD-10-CM | POA: Diagnosis not present

## 2019-10-20 DIAGNOSIS — J301 Allergic rhinitis due to pollen: Secondary | ICD-10-CM | POA: Diagnosis not present

## 2019-10-20 DIAGNOSIS — J3081 Allergic rhinitis due to animal (cat) (dog) hair and dander: Secondary | ICD-10-CM | POA: Diagnosis not present

## 2019-10-20 DIAGNOSIS — J3089 Other allergic rhinitis: Secondary | ICD-10-CM | POA: Diagnosis not present

## 2019-10-27 DIAGNOSIS — J3089 Other allergic rhinitis: Secondary | ICD-10-CM | POA: Diagnosis not present

## 2019-10-27 DIAGNOSIS — J301 Allergic rhinitis due to pollen: Secondary | ICD-10-CM | POA: Diagnosis not present

## 2019-10-27 DIAGNOSIS — J3081 Allergic rhinitis due to animal (cat) (dog) hair and dander: Secondary | ICD-10-CM | POA: Diagnosis not present

## 2019-11-03 DIAGNOSIS — J3081 Allergic rhinitis due to animal (cat) (dog) hair and dander: Secondary | ICD-10-CM | POA: Diagnosis not present

## 2019-11-03 DIAGNOSIS — J301 Allergic rhinitis due to pollen: Secondary | ICD-10-CM | POA: Diagnosis not present

## 2019-11-03 DIAGNOSIS — J3089 Other allergic rhinitis: Secondary | ICD-10-CM | POA: Diagnosis not present

## 2019-11-10 DIAGNOSIS — J301 Allergic rhinitis due to pollen: Secondary | ICD-10-CM | POA: Diagnosis not present

## 2019-11-10 DIAGNOSIS — J3089 Other allergic rhinitis: Secondary | ICD-10-CM | POA: Diagnosis not present

## 2019-11-10 DIAGNOSIS — J3081 Allergic rhinitis due to animal (cat) (dog) hair and dander: Secondary | ICD-10-CM | POA: Diagnosis not present

## 2019-11-24 DIAGNOSIS — J301 Allergic rhinitis due to pollen: Secondary | ICD-10-CM | POA: Diagnosis not present

## 2019-11-24 DIAGNOSIS — J3089 Other allergic rhinitis: Secondary | ICD-10-CM | POA: Diagnosis not present

## 2019-11-24 DIAGNOSIS — J3081 Allergic rhinitis due to animal (cat) (dog) hair and dander: Secondary | ICD-10-CM | POA: Diagnosis not present

## 2019-12-01 DIAGNOSIS — J301 Allergic rhinitis due to pollen: Secondary | ICD-10-CM | POA: Diagnosis not present

## 2019-12-01 DIAGNOSIS — J3081 Allergic rhinitis due to animal (cat) (dog) hair and dander: Secondary | ICD-10-CM | POA: Diagnosis not present

## 2019-12-01 DIAGNOSIS — J3089 Other allergic rhinitis: Secondary | ICD-10-CM | POA: Diagnosis not present

## 2019-12-08 DIAGNOSIS — J3081 Allergic rhinitis due to animal (cat) (dog) hair and dander: Secondary | ICD-10-CM | POA: Diagnosis not present

## 2019-12-08 DIAGNOSIS — J301 Allergic rhinitis due to pollen: Secondary | ICD-10-CM | POA: Diagnosis not present

## 2019-12-08 DIAGNOSIS — J3089 Other allergic rhinitis: Secondary | ICD-10-CM | POA: Diagnosis not present

## 2019-12-15 DIAGNOSIS — J3089 Other allergic rhinitis: Secondary | ICD-10-CM | POA: Diagnosis not present

## 2019-12-15 DIAGNOSIS — J3081 Allergic rhinitis due to animal (cat) (dog) hair and dander: Secondary | ICD-10-CM | POA: Diagnosis not present

## 2019-12-15 DIAGNOSIS — J301 Allergic rhinitis due to pollen: Secondary | ICD-10-CM | POA: Diagnosis not present

## 2019-12-22 DIAGNOSIS — J3081 Allergic rhinitis due to animal (cat) (dog) hair and dander: Secondary | ICD-10-CM | POA: Diagnosis not present

## 2019-12-22 DIAGNOSIS — J301 Allergic rhinitis due to pollen: Secondary | ICD-10-CM | POA: Diagnosis not present

## 2019-12-22 DIAGNOSIS — J4530 Mild persistent asthma, uncomplicated: Secondary | ICD-10-CM | POA: Diagnosis not present

## 2019-12-22 DIAGNOSIS — J3089 Other allergic rhinitis: Secondary | ICD-10-CM | POA: Diagnosis not present

## 2019-12-29 DIAGNOSIS — J3081 Allergic rhinitis due to animal (cat) (dog) hair and dander: Secondary | ICD-10-CM | POA: Diagnosis not present

## 2019-12-29 DIAGNOSIS — J3089 Other allergic rhinitis: Secondary | ICD-10-CM | POA: Diagnosis not present

## 2019-12-29 DIAGNOSIS — J301 Allergic rhinitis due to pollen: Secondary | ICD-10-CM | POA: Diagnosis not present

## 2020-01-05 DIAGNOSIS — J3081 Allergic rhinitis due to animal (cat) (dog) hair and dander: Secondary | ICD-10-CM | POA: Diagnosis not present

## 2020-01-05 DIAGNOSIS — J301 Allergic rhinitis due to pollen: Secondary | ICD-10-CM | POA: Diagnosis not present

## 2020-01-05 DIAGNOSIS — J3089 Other allergic rhinitis: Secondary | ICD-10-CM | POA: Diagnosis not present

## 2020-01-12 DIAGNOSIS — J3081 Allergic rhinitis due to animal (cat) (dog) hair and dander: Secondary | ICD-10-CM | POA: Diagnosis not present

## 2020-01-12 DIAGNOSIS — J301 Allergic rhinitis due to pollen: Secondary | ICD-10-CM | POA: Diagnosis not present

## 2020-01-12 DIAGNOSIS — J3089 Other allergic rhinitis: Secondary | ICD-10-CM | POA: Diagnosis not present

## 2020-01-19 DIAGNOSIS — J3089 Other allergic rhinitis: Secondary | ICD-10-CM | POA: Diagnosis not present

## 2020-01-19 DIAGNOSIS — J301 Allergic rhinitis due to pollen: Secondary | ICD-10-CM | POA: Diagnosis not present

## 2020-01-19 DIAGNOSIS — J3081 Allergic rhinitis due to animal (cat) (dog) hair and dander: Secondary | ICD-10-CM | POA: Diagnosis not present

## 2020-01-26 DIAGNOSIS — Z23 Encounter for immunization: Secondary | ICD-10-CM | POA: Diagnosis not present

## 2020-01-26 DIAGNOSIS — J3081 Allergic rhinitis due to animal (cat) (dog) hair and dander: Secondary | ICD-10-CM | POA: Diagnosis not present

## 2020-01-26 DIAGNOSIS — J3089 Other allergic rhinitis: Secondary | ICD-10-CM | POA: Diagnosis not present

## 2020-01-26 DIAGNOSIS — J301 Allergic rhinitis due to pollen: Secondary | ICD-10-CM | POA: Diagnosis not present

## 2020-01-31 ENCOUNTER — Ambulatory Visit: Payer: Medicare PPO | Admitting: Podiatry

## 2020-01-31 ENCOUNTER — Other Ambulatory Visit: Payer: Self-pay

## 2020-01-31 DIAGNOSIS — B351 Tinea unguium: Secondary | ICD-10-CM | POA: Diagnosis not present

## 2020-01-31 DIAGNOSIS — M79675 Pain in left toe(s): Secondary | ICD-10-CM | POA: Diagnosis not present

## 2020-01-31 DIAGNOSIS — M79674 Pain in right toe(s): Secondary | ICD-10-CM

## 2020-02-01 ENCOUNTER — Encounter: Payer: Self-pay | Admitting: Podiatry

## 2020-02-01 NOTE — Progress Notes (Signed)
  Subjective:  Patient ID: Yesenia Welch, female    DOB: 1950-08-14,  MRN: [id number]  Chief Complaint  Patient presents with  . routine foot care    nail trim    69 y.o. female returns for the above complaint.  Patient presents with thickened elongated dystrophic toenails x10.  Patient is a the long.  She is not able to cut them down herself.  She would like for me to do it.  She denies any other acute complaints.  She is not a diabetic.  Objective:  There were no vitals filed for this visit. Podiatric Exam: Vascular: dorsalis pedis and posterior tibial pulses are palpable bilateral. Capillary return is immediate. Temperature gradient is WNL. Skin turgor WNL  Sensorium: Normal Semmes Weinstein monofilament test. Normal tactile sensation bilaterally. Nail Exam: Pt has thick disfigured discolored nails with subungual debris noted bilateral entire nail hallux through fifth toenails.  Pain on palpation to the nails. Ulcer Exam: There is no evidence of ulcer or pre-ulcerative changes or infection. Orthopedic Exam: Muscle tone and strength are WNL. No limitations in general ROM. No crepitus or effusions noted. HAV  B/L.  Hammer toes 2-5  B/L. Skin: No Porokeratosis. No infection or ulcers    Assessment & Plan:   1. Pain due to onychomycosis of toenails of both feet     Patient was evaluated and treated and all questions answered.  Onychomycosis with pain  -Nails palliatively debrided as below. -Educated on self-care  Procedure: Nail Debridement Rationale: pain  Type of Debridement: manual, sharp debridement. Instrumentation: Nail nipper, rotary burr. Number of Nails: 10  Procedures and Treatment: Consent by patient was obtained for treatment procedures. The patient understood the discussion of treatment and procedures well. All questions were answered thoroughly reviewed. Debridement of mycotic and hypertrophic toenails, 1 through 5 bilateral and clearing of subungual debris. No  ulceration, no infection noted.  Return Visit-Office Procedure: Patient instructed to return to the office for a follow up visit 3 months for continued evaluation and treatment.  Boneta Lucks, DPM    No follow-ups on file.

## 2020-02-02 DIAGNOSIS — J3089 Other allergic rhinitis: Secondary | ICD-10-CM | POA: Diagnosis not present

## 2020-02-02 DIAGNOSIS — J301 Allergic rhinitis due to pollen: Secondary | ICD-10-CM | POA: Diagnosis not present

## 2020-02-02 DIAGNOSIS — J3081 Allergic rhinitis due to animal (cat) (dog) hair and dander: Secondary | ICD-10-CM | POA: Diagnosis not present

## 2020-02-09 DIAGNOSIS — J3081 Allergic rhinitis due to animal (cat) (dog) hair and dander: Secondary | ICD-10-CM | POA: Diagnosis not present

## 2020-02-09 DIAGNOSIS — J3089 Other allergic rhinitis: Secondary | ICD-10-CM | POA: Diagnosis not present

## 2020-02-09 DIAGNOSIS — J301 Allergic rhinitis due to pollen: Secondary | ICD-10-CM | POA: Diagnosis not present

## 2020-02-16 DIAGNOSIS — J3089 Other allergic rhinitis: Secondary | ICD-10-CM | POA: Diagnosis not present

## 2020-02-16 DIAGNOSIS — J301 Allergic rhinitis due to pollen: Secondary | ICD-10-CM | POA: Diagnosis not present

## 2020-02-16 DIAGNOSIS — J3081 Allergic rhinitis due to animal (cat) (dog) hair and dander: Secondary | ICD-10-CM | POA: Diagnosis not present

## 2020-02-23 DIAGNOSIS — J3089 Other allergic rhinitis: Secondary | ICD-10-CM | POA: Diagnosis not present

## 2020-02-23 DIAGNOSIS — J3081 Allergic rhinitis due to animal (cat) (dog) hair and dander: Secondary | ICD-10-CM | POA: Diagnosis not present

## 2020-02-23 DIAGNOSIS — J301 Allergic rhinitis due to pollen: Secondary | ICD-10-CM | POA: Diagnosis not present

## 2020-03-01 DIAGNOSIS — J3081 Allergic rhinitis due to animal (cat) (dog) hair and dander: Secondary | ICD-10-CM | POA: Diagnosis not present

## 2020-03-01 DIAGNOSIS — J3089 Other allergic rhinitis: Secondary | ICD-10-CM | POA: Diagnosis not present

## 2020-03-01 DIAGNOSIS — J301 Allergic rhinitis due to pollen: Secondary | ICD-10-CM | POA: Diagnosis not present

## 2020-03-15 ENCOUNTER — Other Ambulatory Visit (HOSPITAL_COMMUNITY): Payer: Self-pay | Admitting: Internal Medicine

## 2020-03-15 DIAGNOSIS — J3081 Allergic rhinitis due to animal (cat) (dog) hair and dander: Secondary | ICD-10-CM | POA: Diagnosis not present

## 2020-03-15 DIAGNOSIS — J3089 Other allergic rhinitis: Secondary | ICD-10-CM | POA: Diagnosis not present

## 2020-03-15 DIAGNOSIS — J301 Allergic rhinitis due to pollen: Secondary | ICD-10-CM | POA: Diagnosis not present

## 2020-03-15 DIAGNOSIS — Z1231 Encounter for screening mammogram for malignant neoplasm of breast: Secondary | ICD-10-CM

## 2020-03-22 DIAGNOSIS — J3081 Allergic rhinitis due to animal (cat) (dog) hair and dander: Secondary | ICD-10-CM | POA: Diagnosis not present

## 2020-03-22 DIAGNOSIS — J3089 Other allergic rhinitis: Secondary | ICD-10-CM | POA: Diagnosis not present

## 2020-03-22 DIAGNOSIS — J301 Allergic rhinitis due to pollen: Secondary | ICD-10-CM | POA: Diagnosis not present

## 2020-03-28 DIAGNOSIS — J3089 Other allergic rhinitis: Secondary | ICD-10-CM | POA: Diagnosis not present

## 2020-03-28 DIAGNOSIS — J301 Allergic rhinitis due to pollen: Secondary | ICD-10-CM | POA: Diagnosis not present

## 2020-03-28 DIAGNOSIS — J3081 Allergic rhinitis due to animal (cat) (dog) hair and dander: Secondary | ICD-10-CM | POA: Diagnosis not present

## 2020-04-11 DIAGNOSIS — J301 Allergic rhinitis due to pollen: Secondary | ICD-10-CM | POA: Diagnosis not present

## 2020-04-11 DIAGNOSIS — J3089 Other allergic rhinitis: Secondary | ICD-10-CM | POA: Diagnosis not present

## 2020-04-11 DIAGNOSIS — J3081 Allergic rhinitis due to animal (cat) (dog) hair and dander: Secondary | ICD-10-CM | POA: Diagnosis not present

## 2020-04-19 DIAGNOSIS — J3089 Other allergic rhinitis: Secondary | ICD-10-CM | POA: Diagnosis not present

## 2020-04-19 DIAGNOSIS — J3081 Allergic rhinitis due to animal (cat) (dog) hair and dander: Secondary | ICD-10-CM | POA: Diagnosis not present

## 2020-04-19 DIAGNOSIS — J301 Allergic rhinitis due to pollen: Secondary | ICD-10-CM | POA: Diagnosis not present

## 2020-04-22 ENCOUNTER — Ambulatory Visit (HOSPITAL_COMMUNITY)
Admission: RE | Admit: 2020-04-22 | Discharge: 2020-04-22 | Disposition: A | Discharge status: Home / Self Care | Payer: Medicare PPO | Source: Ambulatory Visit | Attending: Internal Medicine | Admitting: Internal Medicine

## 2020-04-22 ENCOUNTER — Other Ambulatory Visit: Payer: Self-pay

## 2020-04-22 DIAGNOSIS — Z1231 Encounter for screening mammogram for malignant neoplasm of breast: Secondary | ICD-10-CM | POA: Insufficient documentation

## 2020-04-26 DIAGNOSIS — J301 Allergic rhinitis due to pollen: Secondary | ICD-10-CM | POA: Diagnosis not present

## 2020-04-26 DIAGNOSIS — J3089 Other allergic rhinitis: Secondary | ICD-10-CM | POA: Diagnosis not present

## 2020-04-26 DIAGNOSIS — J3081 Allergic rhinitis due to animal (cat) (dog) hair and dander: Secondary | ICD-10-CM | POA: Diagnosis not present

## 2020-05-08 ENCOUNTER — Ambulatory Visit: Payer: Medicare PPO | Admitting: Podiatry

## 2020-05-09 DIAGNOSIS — J3089 Other allergic rhinitis: Secondary | ICD-10-CM | POA: Diagnosis not present

## 2020-05-09 DIAGNOSIS — K649 Unspecified hemorrhoids: Secondary | ICD-10-CM | POA: Diagnosis not present

## 2020-05-09 DIAGNOSIS — J3081 Allergic rhinitis due to animal (cat) (dog) hair and dander: Secondary | ICD-10-CM | POA: Diagnosis not present

## 2020-05-09 DIAGNOSIS — Z6823 Body mass index (BMI) 23.0-23.9, adult: Secondary | ICD-10-CM | POA: Diagnosis not present

## 2020-05-09 DIAGNOSIS — J301 Allergic rhinitis due to pollen: Secondary | ICD-10-CM | POA: Diagnosis not present

## 2020-05-09 DIAGNOSIS — Z01419 Encounter for gynecological examination (general) (routine) without abnormal findings: Secondary | ICD-10-CM | POA: Diagnosis not present

## 2020-05-09 DIAGNOSIS — N952 Postmenopausal atrophic vaginitis: Secondary | ICD-10-CM | POA: Diagnosis not present

## 2020-05-17 DIAGNOSIS — J301 Allergic rhinitis due to pollen: Secondary | ICD-10-CM | POA: Diagnosis not present

## 2020-05-17 DIAGNOSIS — J3089 Other allergic rhinitis: Secondary | ICD-10-CM | POA: Diagnosis not present

## 2020-05-17 DIAGNOSIS — J3081 Allergic rhinitis due to animal (cat) (dog) hair and dander: Secondary | ICD-10-CM | POA: Diagnosis not present

## 2020-05-29 DIAGNOSIS — D1801 Hemangioma of skin and subcutaneous tissue: Secondary | ICD-10-CM | POA: Diagnosis not present

## 2020-05-29 DIAGNOSIS — L570 Actinic keratosis: Secondary | ICD-10-CM | POA: Diagnosis not present

## 2020-05-29 DIAGNOSIS — I781 Nevus, non-neoplastic: Secondary | ICD-10-CM | POA: Diagnosis not present

## 2020-05-29 DIAGNOSIS — L814 Other melanin hyperpigmentation: Secondary | ICD-10-CM | POA: Diagnosis not present

## 2020-05-31 DIAGNOSIS — J301 Allergic rhinitis due to pollen: Secondary | ICD-10-CM | POA: Diagnosis not present

## 2020-05-31 DIAGNOSIS — J3081 Allergic rhinitis due to animal (cat) (dog) hair and dander: Secondary | ICD-10-CM | POA: Diagnosis not present

## 2020-05-31 DIAGNOSIS — J3089 Other allergic rhinitis: Secondary | ICD-10-CM | POA: Diagnosis not present

## 2020-06-07 DIAGNOSIS — J301 Allergic rhinitis due to pollen: Secondary | ICD-10-CM | POA: Diagnosis not present

## 2020-06-07 DIAGNOSIS — J3081 Allergic rhinitis due to animal (cat) (dog) hair and dander: Secondary | ICD-10-CM | POA: Diagnosis not present

## 2020-06-07 DIAGNOSIS — J3089 Other allergic rhinitis: Secondary | ICD-10-CM | POA: Diagnosis not present

## 2020-06-14 DIAGNOSIS — J301 Allergic rhinitis due to pollen: Secondary | ICD-10-CM | POA: Diagnosis not present

## 2020-06-14 DIAGNOSIS — J3089 Other allergic rhinitis: Secondary | ICD-10-CM | POA: Diagnosis not present

## 2020-06-14 DIAGNOSIS — J3081 Allergic rhinitis due to animal (cat) (dog) hair and dander: Secondary | ICD-10-CM | POA: Diagnosis not present

## 2020-06-21 DIAGNOSIS — J3081 Allergic rhinitis due to animal (cat) (dog) hair and dander: Secondary | ICD-10-CM | POA: Diagnosis not present

## 2020-06-21 DIAGNOSIS — J3089 Other allergic rhinitis: Secondary | ICD-10-CM | POA: Diagnosis not present

## 2020-06-21 DIAGNOSIS — J301 Allergic rhinitis due to pollen: Secondary | ICD-10-CM | POA: Diagnosis not present

## 2020-06-28 DIAGNOSIS — J301 Allergic rhinitis due to pollen: Secondary | ICD-10-CM | POA: Diagnosis not present

## 2020-06-28 DIAGNOSIS — J3081 Allergic rhinitis due to animal (cat) (dog) hair and dander: Secondary | ICD-10-CM | POA: Diagnosis not present

## 2020-06-28 DIAGNOSIS — J3089 Other allergic rhinitis: Secondary | ICD-10-CM | POA: Diagnosis not present

## 2020-07-08 DIAGNOSIS — E559 Vitamin D deficiency, unspecified: Secondary | ICD-10-CM | POA: Diagnosis not present

## 2020-07-08 DIAGNOSIS — Z79899 Other long term (current) drug therapy: Secondary | ICD-10-CM | POA: Diagnosis not present

## 2020-07-08 DIAGNOSIS — K580 Irritable bowel syndrome with diarrhea: Secondary | ICD-10-CM | POA: Diagnosis not present

## 2020-07-08 DIAGNOSIS — E785 Hyperlipidemia, unspecified: Secondary | ICD-10-CM | POA: Diagnosis not present

## 2020-07-08 DIAGNOSIS — M810 Age-related osteoporosis without current pathological fracture: Secondary | ICD-10-CM | POA: Diagnosis not present

## 2020-07-23 DIAGNOSIS — J3089 Other allergic rhinitis: Secondary | ICD-10-CM | POA: Diagnosis not present

## 2020-07-23 DIAGNOSIS — J301 Allergic rhinitis due to pollen: Secondary | ICD-10-CM | POA: Diagnosis not present

## 2020-07-23 DIAGNOSIS — J3081 Allergic rhinitis due to animal (cat) (dog) hair and dander: Secondary | ICD-10-CM | POA: Diagnosis not present

## 2020-07-25 ENCOUNTER — Other Ambulatory Visit (HOSPITAL_COMMUNITY): Payer: Self-pay | Admitting: Internal Medicine

## 2020-07-25 DIAGNOSIS — Z85820 Personal history of malignant melanoma of skin: Secondary | ICD-10-CM | POA: Diagnosis not present

## 2020-07-25 DIAGNOSIS — Z0001 Encounter for general adult medical examination with abnormal findings: Secondary | ICD-10-CM | POA: Diagnosis not present

## 2020-07-25 DIAGNOSIS — N950 Postmenopausal bleeding: Secondary | ICD-10-CM

## 2020-07-25 DIAGNOSIS — E785 Hyperlipidemia, unspecified: Secondary | ICD-10-CM | POA: Diagnosis not present

## 2020-07-25 DIAGNOSIS — M810 Age-related osteoporosis without current pathological fracture: Secondary | ICD-10-CM | POA: Diagnosis not present

## 2020-07-25 DIAGNOSIS — K589 Irritable bowel syndrome without diarrhea: Secondary | ICD-10-CM | POA: Diagnosis not present

## 2020-07-25 DIAGNOSIS — J4520 Mild intermittent asthma, uncomplicated: Secondary | ICD-10-CM | POA: Diagnosis not present

## 2020-08-05 DIAGNOSIS — J301 Allergic rhinitis due to pollen: Secondary | ICD-10-CM | POA: Diagnosis not present

## 2020-08-05 DIAGNOSIS — J3089 Other allergic rhinitis: Secondary | ICD-10-CM | POA: Diagnosis not present

## 2020-08-05 DIAGNOSIS — J3081 Allergic rhinitis due to animal (cat) (dog) hair and dander: Secondary | ICD-10-CM | POA: Diagnosis not present

## 2020-08-14 ENCOUNTER — Other Ambulatory Visit (HOSPITAL_COMMUNITY): Payer: Medicare PPO

## 2020-08-16 ENCOUNTER — Other Ambulatory Visit: Payer: Self-pay

## 2020-08-16 ENCOUNTER — Ambulatory Visit (HOSPITAL_COMMUNITY)
Admission: RE | Admit: 2020-08-16 | Discharge: 2020-08-16 | Disposition: A | Discharge status: Home / Self Care | Payer: Medicare PPO | Source: Ambulatory Visit | Attending: Internal Medicine | Admitting: Internal Medicine

## 2020-08-16 DIAGNOSIS — Z13820 Encounter for screening for osteoporosis: Secondary | ICD-10-CM | POA: Insufficient documentation

## 2020-08-16 DIAGNOSIS — M8589 Other specified disorders of bone density and structure, multiple sites: Secondary | ICD-10-CM | POA: Insufficient documentation

## 2020-08-16 DIAGNOSIS — N950 Postmenopausal bleeding: Secondary | ICD-10-CM

## 2020-08-16 DIAGNOSIS — Z780 Asymptomatic menopausal state: Secondary | ICD-10-CM | POA: Diagnosis not present

## 2020-08-16 DIAGNOSIS — J301 Allergic rhinitis due to pollen: Secondary | ICD-10-CM | POA: Diagnosis not present

## 2020-08-16 DIAGNOSIS — J3089 Other allergic rhinitis: Secondary | ICD-10-CM | POA: Diagnosis not present

## 2020-08-19 DIAGNOSIS — J301 Allergic rhinitis due to pollen: Secondary | ICD-10-CM | POA: Diagnosis not present

## 2020-08-19 DIAGNOSIS — J3081 Allergic rhinitis due to animal (cat) (dog) hair and dander: Secondary | ICD-10-CM | POA: Diagnosis not present

## 2020-08-19 DIAGNOSIS — J3089 Other allergic rhinitis: Secondary | ICD-10-CM | POA: Diagnosis not present

## 2020-08-28 DIAGNOSIS — H02403 Unspecified ptosis of bilateral eyelids: Secondary | ICD-10-CM | POA: Diagnosis not present

## 2020-08-28 DIAGNOSIS — H18603 Keratoconus, unspecified, bilateral: Secondary | ICD-10-CM | POA: Diagnosis not present

## 2020-08-28 DIAGNOSIS — H2513 Age-related nuclear cataract, bilateral: Secondary | ICD-10-CM | POA: Diagnosis not present

## 2020-08-30 DIAGNOSIS — J3089 Other allergic rhinitis: Secondary | ICD-10-CM | POA: Diagnosis not present

## 2020-08-30 DIAGNOSIS — J3081 Allergic rhinitis due to animal (cat) (dog) hair and dander: Secondary | ICD-10-CM | POA: Diagnosis not present

## 2020-08-30 DIAGNOSIS — J301 Allergic rhinitis due to pollen: Secondary | ICD-10-CM | POA: Diagnosis not present

## 2020-09-06 DIAGNOSIS — J3081 Allergic rhinitis due to animal (cat) (dog) hair and dander: Secondary | ICD-10-CM | POA: Diagnosis not present

## 2020-09-06 DIAGNOSIS — J301 Allergic rhinitis due to pollen: Secondary | ICD-10-CM | POA: Diagnosis not present

## 2020-09-06 DIAGNOSIS — J3089 Other allergic rhinitis: Secondary | ICD-10-CM | POA: Diagnosis not present

## 2020-09-13 DIAGNOSIS — J3089 Other allergic rhinitis: Secondary | ICD-10-CM | POA: Diagnosis not present

## 2020-09-13 DIAGNOSIS — J301 Allergic rhinitis due to pollen: Secondary | ICD-10-CM | POA: Diagnosis not present

## 2020-09-13 DIAGNOSIS — M65331 Trigger finger, right middle finger: Secondary | ICD-10-CM | POA: Diagnosis not present

## 2020-09-13 DIAGNOSIS — J3081 Allergic rhinitis due to animal (cat) (dog) hair and dander: Secondary | ICD-10-CM | POA: Diagnosis not present

## 2020-09-13 DIAGNOSIS — M65341 Trigger finger, right ring finger: Secondary | ICD-10-CM | POA: Diagnosis not present

## 2020-09-20 DIAGNOSIS — J3089 Other allergic rhinitis: Secondary | ICD-10-CM | POA: Diagnosis not present

## 2020-09-20 DIAGNOSIS — J301 Allergic rhinitis due to pollen: Secondary | ICD-10-CM | POA: Diagnosis not present

## 2020-09-20 DIAGNOSIS — J3081 Allergic rhinitis due to animal (cat) (dog) hair and dander: Secondary | ICD-10-CM | POA: Diagnosis not present

## 2020-09-27 DIAGNOSIS — J301 Allergic rhinitis due to pollen: Secondary | ICD-10-CM | POA: Diagnosis not present

## 2020-09-27 DIAGNOSIS — J3081 Allergic rhinitis due to animal (cat) (dog) hair and dander: Secondary | ICD-10-CM | POA: Diagnosis not present

## 2020-09-27 DIAGNOSIS — J3089 Other allergic rhinitis: Secondary | ICD-10-CM | POA: Diagnosis not present

## 2020-10-18 DIAGNOSIS — J301 Allergic rhinitis due to pollen: Secondary | ICD-10-CM | POA: Diagnosis not present

## 2020-10-18 DIAGNOSIS — J3089 Other allergic rhinitis: Secondary | ICD-10-CM | POA: Diagnosis not present

## 2020-10-18 DIAGNOSIS — J3081 Allergic rhinitis due to animal (cat) (dog) hair and dander: Secondary | ICD-10-CM | POA: Diagnosis not present

## 2020-10-25 DIAGNOSIS — J3089 Other allergic rhinitis: Secondary | ICD-10-CM | POA: Diagnosis not present

## 2020-10-25 DIAGNOSIS — J3081 Allergic rhinitis due to animal (cat) (dog) hair and dander: Secondary | ICD-10-CM | POA: Diagnosis not present

## 2020-10-25 DIAGNOSIS — J301 Allergic rhinitis due to pollen: Secondary | ICD-10-CM | POA: Diagnosis not present

## 2020-11-15 DIAGNOSIS — J301 Allergic rhinitis due to pollen: Secondary | ICD-10-CM | POA: Diagnosis not present

## 2020-11-15 DIAGNOSIS — J3089 Other allergic rhinitis: Secondary | ICD-10-CM | POA: Diagnosis not present

## 2020-11-15 DIAGNOSIS — J3081 Allergic rhinitis due to animal (cat) (dog) hair and dander: Secondary | ICD-10-CM | POA: Diagnosis not present

## 2020-11-25 DIAGNOSIS — J3089 Other allergic rhinitis: Secondary | ICD-10-CM | POA: Diagnosis not present

## 2020-11-25 DIAGNOSIS — J301 Allergic rhinitis due to pollen: Secondary | ICD-10-CM | POA: Diagnosis not present

## 2020-11-25 DIAGNOSIS — J3081 Allergic rhinitis due to animal (cat) (dog) hair and dander: Secondary | ICD-10-CM | POA: Diagnosis not present

## 2020-12-13 DIAGNOSIS — J3081 Allergic rhinitis due to animal (cat) (dog) hair and dander: Secondary | ICD-10-CM | POA: Diagnosis not present

## 2020-12-13 DIAGNOSIS — J3089 Other allergic rhinitis: Secondary | ICD-10-CM | POA: Diagnosis not present

## 2020-12-13 DIAGNOSIS — J301 Allergic rhinitis due to pollen: Secondary | ICD-10-CM | POA: Diagnosis not present

## 2020-12-27 DIAGNOSIS — J4530 Mild persistent asthma, uncomplicated: Secondary | ICD-10-CM | POA: Diagnosis not present

## 2020-12-27 DIAGNOSIS — J3089 Other allergic rhinitis: Secondary | ICD-10-CM | POA: Diagnosis not present

## 2020-12-27 DIAGNOSIS — J301 Allergic rhinitis due to pollen: Secondary | ICD-10-CM | POA: Diagnosis not present

## 2020-12-27 DIAGNOSIS — J3081 Allergic rhinitis due to animal (cat) (dog) hair and dander: Secondary | ICD-10-CM | POA: Diagnosis not present

## 2021-01-17 DIAGNOSIS — J3089 Other allergic rhinitis: Secondary | ICD-10-CM | POA: Diagnosis not present

## 2021-01-17 DIAGNOSIS — J301 Allergic rhinitis due to pollen: Secondary | ICD-10-CM | POA: Diagnosis not present

## 2021-01-17 DIAGNOSIS — J3081 Allergic rhinitis due to animal (cat) (dog) hair and dander: Secondary | ICD-10-CM | POA: Diagnosis not present

## 2021-01-31 DIAGNOSIS — J3081 Allergic rhinitis due to animal (cat) (dog) hair and dander: Secondary | ICD-10-CM | POA: Diagnosis not present

## 2021-01-31 DIAGNOSIS — J301 Allergic rhinitis due to pollen: Secondary | ICD-10-CM | POA: Diagnosis not present

## 2021-01-31 DIAGNOSIS — J3089 Other allergic rhinitis: Secondary | ICD-10-CM | POA: Diagnosis not present

## 2021-02-07 DIAGNOSIS — J301 Allergic rhinitis due to pollen: Secondary | ICD-10-CM | POA: Diagnosis not present

## 2021-02-07 DIAGNOSIS — J3081 Allergic rhinitis due to animal (cat) (dog) hair and dander: Secondary | ICD-10-CM | POA: Diagnosis not present

## 2021-02-07 DIAGNOSIS — J3089 Other allergic rhinitis: Secondary | ICD-10-CM | POA: Diagnosis not present

## 2021-02-28 DIAGNOSIS — J3089 Other allergic rhinitis: Secondary | ICD-10-CM | POA: Diagnosis not present

## 2021-02-28 DIAGNOSIS — J3081 Allergic rhinitis due to animal (cat) (dog) hair and dander: Secondary | ICD-10-CM | POA: Diagnosis not present

## 2021-02-28 DIAGNOSIS — J301 Allergic rhinitis due to pollen: Secondary | ICD-10-CM | POA: Diagnosis not present

## 2021-03-14 DIAGNOSIS — J301 Allergic rhinitis due to pollen: Secondary | ICD-10-CM | POA: Diagnosis not present

## 2021-03-14 DIAGNOSIS — J3089 Other allergic rhinitis: Secondary | ICD-10-CM | POA: Diagnosis not present

## 2021-03-14 DIAGNOSIS — J3081 Allergic rhinitis due to animal (cat) (dog) hair and dander: Secondary | ICD-10-CM | POA: Diagnosis not present

## 2021-03-21 ENCOUNTER — Other Ambulatory Visit (HOSPITAL_COMMUNITY): Payer: Self-pay | Admitting: Internal Medicine

## 2021-03-21 DIAGNOSIS — Z1231 Encounter for screening mammogram for malignant neoplasm of breast: Secondary | ICD-10-CM

## 2021-04-14 ENCOUNTER — Other Ambulatory Visit: Payer: Self-pay

## 2021-04-14 ENCOUNTER — Ambulatory Visit
Admission: EM | Admit: 2021-04-14 | Discharge: 2021-04-14 | Disposition: A | Discharge status: Home / Self Care | Payer: Medicare PPO | Attending: Urgent Care | Admitting: Urgent Care

## 2021-04-14 ENCOUNTER — Encounter: Payer: Self-pay | Admitting: Emergency Medicine

## 2021-04-14 DIAGNOSIS — J069 Acute upper respiratory infection, unspecified: Secondary | ICD-10-CM

## 2021-04-14 DIAGNOSIS — R0981 Nasal congestion: Secondary | ICD-10-CM | POA: Diagnosis not present

## 2021-04-14 DIAGNOSIS — R0982 Postnasal drip: Secondary | ICD-10-CM | POA: Diagnosis not present

## 2021-04-14 DIAGNOSIS — R052 Subacute cough: Secondary | ICD-10-CM | POA: Diagnosis not present

## 2021-04-14 MED ORDER — PROMETHAZINE-DM 6.25-15 MG/5ML PO SYRP: 5.0000 mL | ORAL_SOLUTION | Freq: Every evening | ORAL | 0 refills | Status: DC | PRN

## 2021-04-14 MED ORDER — PREDNISONE 20 MG PO TABS: ORAL_TABLET | ORAL | 0 refills | Status: DC

## 2021-04-14 MED ORDER — BENZONATATE 100 MG PO CAPS: 100.0000 mg | ORAL_CAPSULE | Freq: Three times a day (TID) | ORAL | 0 refills | Status: DC | PRN

## 2021-04-14 NOTE — ED Triage Notes (Signed)
Pt here with URI sx x 4 days with persistent cough at night that is dry and hacking. Negative home Covid tests.

## 2021-04-14 NOTE — ED Provider Notes (Signed)
[place]   MRN: [id number] DOB: 1951-02-11  Subjective:   Yesenia Welch is a 71 y.o. female presenting for 4 day history of throat pain, mild runny nose, dry hacking cough. No sick contacts. Has a history of asthma, has not needed her inhaler in a while. Takes allergy medications daily and is supposed to get allergy shots but she slacked off in the past month. No smoking. No chest pain, shob, wheezing, body aches.  Patient is traveling next week and would like aggressive management of her symptoms.  States that usually prednisone is very quick acting for her.  No current facility-administered medications for this encounter.  Current Outpatient Medications:    albuterol (PROVENTIL HFA;VENTOLIN HFA) 108 (90 Base) MCG/ACT inhaler, Inhale 1-2 puffs into the lungs as needed for wheezing or shortness of breath., Disp: , Rfl:    alendronate (FOSAMAX) 70 MG tablet, , Disp: , Rfl:    cetirizine (ZYRTEC) 10 MG tablet, Take 10 mg by mouth daily., Disp: , Rfl:    Estradiol 10 MCG TABS vaginal tablet, Imvexxy Maintenance Pack 10 mcg vaginal insert  Insert 1 vaginal insert twice a week by vaginal route., Disp: , Rfl:    fluticasone (FLONASE) 50 MCG/ACT nasal spray, Place 2 sprays into both nostrils daily., Disp: , Rfl:    fluticasone furoate-vilanterol (BREO ELLIPTA) 200-25 MCG/INH AEPB, Inhale 1 puff into the lungs daily. , Disp: , Rfl:    triamcinolone ointment (KENALOG) 0.1 %, triamcinolone acetonide 0.1 % topical ointment  APPLY A THIN LAYER TO THE AFFECTED AREA(S) BY TOPICAL ROUTE 2 TIMES PER DAY x 7 days, Disp: , Rfl:    Allergies  Allergen Reactions   Dust Mite Extract     Patient states she is allergic to house dust   Gluten Meal    Penicillins     Has patient had a PCN reaction causing immediate rash, facial/tongue/throat swelling, SOB or lightheadedness with hypotension: unkn Has patient had a PCN reaction causing severe rash involving mucus membranes or skin necrosis:  unkn Has patient had a PCN reaction that required hospitalization: unkn Has patient had a PCN reaction occurring within the last 10 years: unkn If all of the above answers are "NO", then may proceed with Cephalosporin use.     Past Medical History:  Diagnosis Date   Asthma    Keratoconus of both eyes    Osteopenia    PONV (postoperative nausea and vomiting)      Past Surgical History:  Procedure Laterality Date   BREAST BIOPSY Left 2003   Benign   BREAST LUMPECTOMY  2003   LEFT BREAST   CESAREAN SECTION     October 1975, March 1977   CHOLECYSTECTOMY  1996   COLONOSCOPY  02/12/2012   DILATION AND CURETTAGE OF UTERUS  1973   ENDOMETRIAL ABLATION  2006   UTERINE   ROBOTIC ASSISTED BILATERAL SALPINGO OOPHERECTOMY Bilateral 03/22/2018   Procedure: XI ROBOTIC ASSISTED BILATERAL SALPINGO OOPHORECTOMY WITH PERITONEAL WASHINGS;  Surgeon: Everitt Amber, MD;  Location: [facility];  Service: Gynecology;  Laterality: Bilateral;   SALPINGOOPHORECTOMY     bil Dr. Denman George   03-22-18   TONSILLECTOMY  1956    Family History  Problem Relation Age of Onset   Heart attack Father        CABG @ AGE 80   Ovarian cancer Paternal Aunt     Social History   Tobacco Use   Smoking status: Never   Smokeless tobacco: Never  Vaping Use  Vaping Use: Never used  Substance Use Topics   Alcohol use: Yes    Alcohol/week: 1.0 standard drink    Types: 1 Glasses of wine per week    Comment: daily   Drug use: No    ROS   Objective:   Vitals: BP 130/80    Pulse (!) 59    Temp 98.4 F (36.9 C)    Resp 20    SpO2 98%   Physical Exam Constitutional:      General: She is not in acute distress.    Appearance: Normal appearance. She is well-developed. She is not ill-appearing, toxic-appearing or diaphoretic.  HENT:     Head: Normocephalic and atraumatic.     Right Ear: External ear normal.     Left Ear: External ear normal.     Nose: Congestion present. No rhinorrhea.     Mouth/Throat:     Mouth:  Mucous membranes are moist.     Pharynx: No oropharyngeal exudate or posterior oropharyngeal erythema.     Comments: Post-nasal drainage overlying pharynx.  Eyes:     General: No scleral icterus.       Right eye: No discharge.        Left eye: No discharge.     Extraocular Movements: Extraocular movements intact.  Cardiovascular:     Rate and Rhythm: Normal rate and regular rhythm.     Pulses: Normal pulses.     Heart sounds: Normal heart sounds. No murmur heard.   No friction rub. No gallop.  Pulmonary:     Effort: Pulmonary effort is normal. No respiratory distress.     Breath sounds: Normal breath sounds. No stridor. No wheezing, rhonchi or rales.  Skin:    General: Skin is warm and dry.     Findings: No rash.  Neurological:     Mental Status: She is alert and oriented to person, place, and time.     Cranial Nerves: No cranial nerve deficit.     Motor: No weakness.     Coordination: Coordination normal.     Gait: Gait normal.  Psychiatric:        Mood and Affect: Mood normal.        Behavior: Behavior normal.        Thought Content: Thought content normal.    Assessment and Plan :   PDMP not reviewed this encounter.  1. Viral upper respiratory illness   2. Sinus congestion   3. Post-nasal drainage   4. Subacute cough    Patient declined any kind of respiratory testing as she had done COVID test at home and was negative.  In the context of her allergic rhinitis and asthma I did offer her a steroid course as she requested. Deferred imaging given clear cardiopulmonary exam, hemodynamically stable vital signs.  We will otherwise manage for viral upper respiratory illness with supportive care.  Counseled patient on potential for adverse effects with medications prescribed/recommended today, ER and return-to-clinic precautions discussed, patient verbalized understanding.    Jaynee Eagles, [place] 04/14/21 1206

## 2021-04-18 DIAGNOSIS — J3089 Other allergic rhinitis: Secondary | ICD-10-CM | POA: Diagnosis not present

## 2021-04-18 DIAGNOSIS — J301 Allergic rhinitis due to pollen: Secondary | ICD-10-CM | POA: Diagnosis not present

## 2021-04-18 DIAGNOSIS — J3081 Allergic rhinitis due to animal (cat) (dog) hair and dander: Secondary | ICD-10-CM | POA: Diagnosis not present

## 2021-04-28 ENCOUNTER — Other Ambulatory Visit: Payer: Self-pay

## 2021-04-28 ENCOUNTER — Ambulatory Visit (HOSPITAL_COMMUNITY)
Admission: RE | Admit: 2021-04-28 | Discharge: 2021-04-28 | Disposition: A | Discharge status: Home / Self Care | Payer: Medicare PPO | Source: Ambulatory Visit | Attending: Internal Medicine | Admitting: Internal Medicine

## 2021-04-28 DIAGNOSIS — Z1231 Encounter for screening mammogram for malignant neoplasm of breast: Secondary | ICD-10-CM | POA: Diagnosis not present

## 2021-04-29 DIAGNOSIS — J301 Allergic rhinitis due to pollen: Secondary | ICD-10-CM | POA: Diagnosis not present

## 2021-04-29 DIAGNOSIS — J3089 Other allergic rhinitis: Secondary | ICD-10-CM | POA: Diagnosis not present

## 2021-04-29 DIAGNOSIS — J3081 Allergic rhinitis due to animal (cat) (dog) hair and dander: Secondary | ICD-10-CM | POA: Diagnosis not present

## 2021-05-18 ENCOUNTER — Encounter: Payer: Self-pay | Admitting: Emergency Medicine

## 2021-05-18 ENCOUNTER — Ambulatory Visit
Admission: EM | Admit: 2021-05-18 | Discharge: 2021-05-18 | Disposition: A | Discharge status: Home / Self Care | Payer: Medicare PPO

## 2021-05-18 ENCOUNTER — Other Ambulatory Visit: Payer: Self-pay

## 2021-05-18 DIAGNOSIS — R509 Fever, unspecified: Secondary | ICD-10-CM

## 2021-05-18 DIAGNOSIS — R052 Subacute cough: Secondary | ICD-10-CM | POA: Diagnosis not present

## 2021-05-18 DIAGNOSIS — J4530 Mild persistent asthma, uncomplicated: Secondary | ICD-10-CM

## 2021-05-18 DIAGNOSIS — R490 Dysphonia: Secondary | ICD-10-CM

## 2021-05-18 DIAGNOSIS — R42 Dizziness and giddiness: Secondary | ICD-10-CM | POA: Diagnosis not present

## 2021-05-18 DIAGNOSIS — B349 Viral infection, unspecified: Secondary | ICD-10-CM | POA: Diagnosis not present

## 2021-05-18 DIAGNOSIS — R070 Pain in throat: Secondary | ICD-10-CM

## 2021-05-18 DIAGNOSIS — J3089 Other allergic rhinitis: Secondary | ICD-10-CM | POA: Diagnosis not present

## 2021-05-18 MED ORDER — ACETAMINOPHEN 325 MG PO TABS: 650.0000 mg | ORAL_TABLET | Freq: Once | ORAL | Status: DC

## 2021-05-18 MED ORDER — PROMETHAZINE-DM 6.25-15 MG/5ML PO SYRP: 5.0000 mL | ORAL_SOLUTION | Freq: Every evening | ORAL | 0 refills | Status: AC | PRN

## 2021-05-18 MED ORDER — BENZONATATE 100 MG PO CAPS: 100.0000 mg | ORAL_CAPSULE | Freq: Three times a day (TID) | ORAL | 0 refills | Status: AC | PRN

## 2021-05-18 MED ORDER — ACETAMINOPHEN 500 MG PO TABS
500.0000 mg | ORAL_TABLET | Freq: Once | ORAL | Status: AC
  Administered 2021-05-18: 500 mg via ORAL

## 2021-05-18 MED ORDER — PREDNISONE 50 MG PO TABS: 50.0000 mg | ORAL_TABLET | Freq: Every day | ORAL | 0 refills | Status: DC

## 2021-05-18 NOTE — ED Triage Notes (Signed)
Patient c/o laryngitis for a couple of days, possible vertigo today, febrile.  Home COVID test negative today.  Patient has taken Ibuprofen today.

## 2021-05-18 NOTE — ED Provider Notes (Signed)
[place]   MRN: [id number] DOB: [birth year]  Subjective:   Yesenia Welch is a 71 y.o. female presenting for 2-day history of acute onset productive cough, hoarseness, throat pain, sinus pressure, head fullness, sinus congestion.  Started having vertigo today.  She did contact her allergist and asthma doctor 2 days ago and he provided her with a refill of her albuterol and Breo Ellipta.  She did a COVID test and was negative but is not opposed to rechecking it.  No history of arrhythmias, heart conditions.  No active chest pain, shortness of breath or wheezing.   Current Facility-Administered Medications:    acetaminophen (TYLENOL) tablet 650 mg, 650 mg, Oral, Once, Jaynee Eagles, PA-C  Current Outpatient Medications:    albuterol (PROVENTIL HFA;VENTOLIN HFA) 108 (90 Base) MCG/ACT inhaler, Inhale 1-2 puffs into the lungs as needed for wheezing or shortness of breath., Disp: , Rfl:    alendronate (FOSAMAX) 70 MG tablet, , Disp: , Rfl:    benzonatate (TESSALON) 100 MG capsule, Take 1-2 capsules (100-200 mg total) by mouth 3 (three) times daily as needed for cough., Disp: 60 capsule, Rfl: 0   cetirizine (ZYRTEC) 10 MG tablet, Take 10 mg by mouth daily., Disp: , Rfl:    Estradiol 10 MCG TABS vaginal tablet, Imvexxy Maintenance Pack 10 mcg vaginal insert  Insert 1 vaginal insert twice a week by vaginal route., Disp: , Rfl:    fluticasone (FLONASE) 50 MCG/ACT nasal spray, Place 2 sprays into both nostrils daily., Disp: , Rfl:    fluticasone furoate-vilanterol (BREO ELLIPTA) 200-25 MCG/ACT AEPB, 1 puff, Disp: , Rfl:    predniSONE (DELTASONE) 20 MG tablet, Take 2 tablets daily with breakfast., Disp: 10 tablet, Rfl: 0   promethazine-dextromethorphan (PROMETHAZINE-DM) 6.25-15 MG/5ML syrup, Take 5 mLs by mouth at bedtime as needed for cough., Disp: 100 mL, Rfl: 0   triamcinolone ointment (KENALOG) 0.1 %, triamcinolone acetonide 0.1 % topical ointment  APPLY A THIN LAYER TO THE AFFECTED  AREA(S) BY TOPICAL ROUTE 2 TIMES PER DAY x 7 days, Disp: , Rfl:    fluticasone furoate-vilanterol (BREO ELLIPTA) 200-25 MCG/INH AEPB, Inhale 1 puff into the lungs daily. , Disp: , Rfl:    Allergies  Allergen Reactions   Dust Mite Extract     Patient states she is allergic to house dust   Gluten Meal    Penicillins     Has patient had a PCN reaction causing immediate rash, facial/tongue/throat swelling, SOB or lightheadedness with hypotension: unkn Has patient had a PCN reaction causing severe rash involving mucus membranes or skin necrosis: unkn Has patient had a PCN reaction that required hospitalization: unkn Has patient had a PCN reaction occurring within the last 10 years: unkn If all of the above answers are "NO", then may proceed with Cephalosporin use.     Past Medical History:  Diagnosis Date   Asthma    Keratoconus of both eyes    Osteopenia    PONV (postoperative nausea and vomiting)      Past Surgical History:  Procedure Laterality Date   BREAST BIOPSY Left 2003   Benign   BREAST LUMPECTOMY  2003   LEFT BREAST   CESAREAN SECTION     October 1975, March 1977   CHOLECYSTECTOMY  1996   COLONOSCOPY  02/12/2012   DILATION AND CURETTAGE OF UTERUS  1973   ENDOMETRIAL ABLATION  2006   UTERINE   ROBOTIC ASSISTED BILATERAL SALPINGO OOPHERECTOMY Bilateral 03/22/2018   Procedure: XI ROBOTIC ASSISTED BILATERAL  SALPINGO OOPHORECTOMY WITH PERITONEAL WASHINGS;  Surgeon: Everitt Amber, MD;  Location: [facility];  Service: Gynecology;  Laterality: Bilateral;   SALPINGOOPHORECTOMY     bil Dr. Denman George   03-22-18   TONSILLECTOMY  1956    Family History  Problem Relation Age of Onset   Heart attack Father        CABG @ AGE 55   Ovarian cancer Paternal Aunt     Social History   Tobacco Use   Smoking status: Never   Smokeless tobacco: Never  Vaping Use   Vaping Use: Never used  Substance Use Topics   Alcohol use: Yes    Alcohol/week: 1.0 standard drink    Types: 1 Glasses of  wine per week    Comment: daily   Drug use: No    ROS   Objective:   Vitals: BP 128/89 (BP Location: Right Arm)    Pulse 87    Temp (!) 100.7 F (38.2 C) (Oral)    Resp 18    Ht 5\' 1"  (1.549 m)    Wt 138 lb 0.1 oz (62.6 kg)    SpO2 94%    BMI 26.08 kg/m   Physical Exam Constitutional:      General: She is not in acute distress.    Appearance: Normal appearance. She is well-developed and normal weight. She is not ill-appearing, toxic-appearing or diaphoretic.  HENT:     Head: Normocephalic and atraumatic.     Right Ear: Tympanic membrane, ear canal and external ear normal. No drainage or tenderness. No middle ear effusion. There is no impacted cerumen. Tympanic membrane is not erythematous.     Left Ear: Tympanic membrane, ear canal and external ear normal. No drainage or tenderness.  No middle ear effusion. There is no impacted cerumen. Tympanic membrane is not erythematous.     Nose: Congestion and rhinorrhea present.     Mouth/Throat:     Mouth: Mucous membranes are moist. No oral lesions.     Pharynx: No pharyngeal swelling, oropharyngeal exudate, posterior oropharyngeal erythema or uvula swelling.     Tonsils: No tonsillar exudate or tonsillar abscesses.  Eyes:     General: No scleral icterus.       Right eye: No discharge.        Left eye: No discharge.     Extraocular Movements: Extraocular movements intact.     Right eye: Normal extraocular motion.     Left eye: Normal extraocular motion.     Conjunctiva/sclera: Conjunctivae normal.  Cardiovascular:     Rate and Rhythm: Normal rate.     Heart sounds: No murmur heard.   No friction rub. No gallop.  Pulmonary:     Effort: Pulmonary effort is normal. No respiratory distress.     Breath sounds: No stridor. No wheezing, rhonchi or rales.  Chest:     Chest wall: No tenderness.  Musculoskeletal:     Cervical back: Normal range of motion and neck supple.  Lymphadenopathy:     Cervical: No cervical adenopathy.  Skin:     General: Skin is warm and dry.  Neurological:     General: No focal deficit present.     Mental Status: She is alert and oriented to person, place, and time.     Cranial Nerves: No cranial nerve deficit.     Motor: No weakness.     Coordination: Coordination normal.     Gait: Gait normal.     Deep Tendon Reflexes: Reflexes normal.  Psychiatric:        Mood and Affect: Mood normal.        Behavior: Behavior normal.        Thought Content: Thought content normal.        Judgment: Judgment normal.    Patient given 500 mg Tylenol in clinic.  Assessment and Plan :   PDMP not reviewed this encounter.  1. Acute viral syndrome   2. Fever, unspecified   3. Hoarseness   4. Subacute cough   5. Vertigo   6. Throat pain   7. Mild persistent asthma without complication   8. Allergic rhinitis due to other allergic trigger, unspecified seasonality     Deferred imaging given clear cardiopulmonary exam, hemodynamically stable vital signs.  In the context of her allergic rhinitis and asthma, offered an oral prednisone course.  COVID and flu testing pending.  Recommended supportive care otherwise. Counseled patient on potential for adverse effects with medications prescribed/recommended today, ER and return-to-clinic precautions discussed, patient verbalized understanding.    Jaynee Eagles, [place] 05/18/21 1358

## 2021-05-19 LAB — COVID-19, FLU A+B NAA
Influenza A, NAA: NOT DETECTED
Influenza B, NAA: NOT DETECTED
SARS-CoV-2, NAA: NOT DETECTED

## 2021-05-23 DIAGNOSIS — J3089 Other allergic rhinitis: Secondary | ICD-10-CM | POA: Diagnosis not present

## 2021-05-23 DIAGNOSIS — J3081 Allergic rhinitis due to animal (cat) (dog) hair and dander: Secondary | ICD-10-CM | POA: Diagnosis not present

## 2021-05-23 DIAGNOSIS — J301 Allergic rhinitis due to pollen: Secondary | ICD-10-CM | POA: Diagnosis not present

## 2021-05-26 ENCOUNTER — Other Ambulatory Visit (HOSPITAL_COMMUNITY): Payer: Self-pay | Admitting: Internal Medicine

## 2021-05-26 ENCOUNTER — Ambulatory Visit (HOSPITAL_COMMUNITY)
Admission: RE | Admit: 2021-05-26 | Discharge: 2021-05-26 | Disposition: A | Discharge status: Home / Self Care | Payer: Medicare PPO | Source: Ambulatory Visit | Attending: Internal Medicine | Admitting: Internal Medicine

## 2021-05-26 ENCOUNTER — Other Ambulatory Visit: Payer: Self-pay

## 2021-05-26 DIAGNOSIS — R0989 Other specified symptoms and signs involving the circulatory and respiratory systems: Secondary | ICD-10-CM | POA: Diagnosis not present

## 2021-05-26 DIAGNOSIS — R918 Other nonspecific abnormal finding of lung field: Secondary | ICD-10-CM | POA: Diagnosis not present

## 2021-05-26 DIAGNOSIS — J3089 Other allergic rhinitis: Secondary | ICD-10-CM | POA: Diagnosis not present

## 2021-05-26 DIAGNOSIS — J301 Allergic rhinitis due to pollen: Secondary | ICD-10-CM | POA: Diagnosis not present

## 2021-05-26 DIAGNOSIS — R059 Cough, unspecified: Secondary | ICD-10-CM | POA: Diagnosis not present

## 2021-05-26 DIAGNOSIS — J3081 Allergic rhinitis due to animal (cat) (dog) hair and dander: Secondary | ICD-10-CM | POA: Diagnosis not present

## 2021-05-30 DIAGNOSIS — J301 Allergic rhinitis due to pollen: Secondary | ICD-10-CM | POA: Diagnosis not present

## 2021-05-30 DIAGNOSIS — J3081 Allergic rhinitis due to animal (cat) (dog) hair and dander: Secondary | ICD-10-CM | POA: Diagnosis not present

## 2021-05-30 DIAGNOSIS — J3089 Other allergic rhinitis: Secondary | ICD-10-CM | POA: Diagnosis not present

## 2021-06-05 DIAGNOSIS — J3081 Allergic rhinitis due to animal (cat) (dog) hair and dander: Secondary | ICD-10-CM | POA: Diagnosis not present

## 2021-06-05 DIAGNOSIS — J3089 Other allergic rhinitis: Secondary | ICD-10-CM | POA: Diagnosis not present

## 2021-06-05 DIAGNOSIS — J301 Allergic rhinitis due to pollen: Secondary | ICD-10-CM | POA: Diagnosis not present

## 2021-06-06 ENCOUNTER — Other Ambulatory Visit: Payer: Self-pay | Admitting: Internal Medicine

## 2021-06-06 ENCOUNTER — Other Ambulatory Visit (HOSPITAL_COMMUNITY): Payer: Self-pay | Admitting: Internal Medicine

## 2021-06-06 ENCOUNTER — Ambulatory Visit (HOSPITAL_COMMUNITY)
Admission: RE | Admit: 2021-06-06 | Discharge: 2021-06-06 | Disposition: A | Discharge status: Home / Self Care | Payer: Medicare PPO | Source: Ambulatory Visit | Attending: Internal Medicine | Admitting: Internal Medicine

## 2021-06-06 ENCOUNTER — Other Ambulatory Visit: Payer: Self-pay

## 2021-06-06 DIAGNOSIS — R918 Other nonspecific abnormal finding of lung field: Secondary | ICD-10-CM | POA: Diagnosis not present

## 2021-06-06 DIAGNOSIS — R051 Acute cough: Secondary | ICD-10-CM | POA: Diagnosis not present

## 2021-06-06 DIAGNOSIS — R053 Chronic cough: Secondary | ICD-10-CM

## 2021-06-06 DIAGNOSIS — J189 Pneumonia, unspecified organism: Secondary | ICD-10-CM | POA: Diagnosis not present

## 2021-06-06 DIAGNOSIS — R911 Solitary pulmonary nodule: Secondary | ICD-10-CM | POA: Diagnosis not present

## 2021-06-13 DIAGNOSIS — J301 Allergic rhinitis due to pollen: Secondary | ICD-10-CM | POA: Diagnosis not present

## 2021-06-13 DIAGNOSIS — J3089 Other allergic rhinitis: Secondary | ICD-10-CM | POA: Diagnosis not present

## 2021-06-13 DIAGNOSIS — J3081 Allergic rhinitis due to animal (cat) (dog) hair and dander: Secondary | ICD-10-CM | POA: Diagnosis not present

## 2021-06-19 DIAGNOSIS — J3089 Other allergic rhinitis: Secondary | ICD-10-CM | POA: Diagnosis not present

## 2021-06-19 DIAGNOSIS — J301 Allergic rhinitis due to pollen: Secondary | ICD-10-CM | POA: Diagnosis not present

## 2021-06-19 DIAGNOSIS — J3081 Allergic rhinitis due to animal (cat) (dog) hair and dander: Secondary | ICD-10-CM | POA: Diagnosis not present

## 2021-06-27 DIAGNOSIS — J301 Allergic rhinitis due to pollen: Secondary | ICD-10-CM | POA: Diagnosis not present

## 2021-06-27 DIAGNOSIS — J3089 Other allergic rhinitis: Secondary | ICD-10-CM | POA: Diagnosis not present

## 2021-06-27 DIAGNOSIS — J3081 Allergic rhinitis due to animal (cat) (dog) hair and dander: Secondary | ICD-10-CM | POA: Diagnosis not present

## 2021-06-30 DIAGNOSIS — Z1239 Encounter for other screening for malignant neoplasm of breast: Secondary | ICD-10-CM | POA: Diagnosis not present

## 2021-06-30 DIAGNOSIS — M8580 Other specified disorders of bone density and structure, unspecified site: Secondary | ICD-10-CM | POA: Diagnosis not present

## 2021-06-30 DIAGNOSIS — R350 Frequency of micturition: Secondary | ICD-10-CM | POA: Diagnosis not present

## 2021-06-30 DIAGNOSIS — Z01419 Encounter for gynecological examination (general) (routine) without abnormal findings: Secondary | ICD-10-CM | POA: Diagnosis not present

## 2021-06-30 DIAGNOSIS — Z124 Encounter for screening for malignant neoplasm of cervix: Secondary | ICD-10-CM | POA: Diagnosis not present

## 2021-06-30 DIAGNOSIS — Z1211 Encounter for screening for malignant neoplasm of colon: Secondary | ICD-10-CM | POA: Diagnosis not present

## 2021-07-04 DIAGNOSIS — J3081 Allergic rhinitis due to animal (cat) (dog) hair and dander: Secondary | ICD-10-CM | POA: Diagnosis not present

## 2021-07-04 DIAGNOSIS — J3089 Other allergic rhinitis: Secondary | ICD-10-CM | POA: Diagnosis not present

## 2021-07-04 DIAGNOSIS — J301 Allergic rhinitis due to pollen: Secondary | ICD-10-CM | POA: Diagnosis not present

## 2021-07-21 DIAGNOSIS — M810 Age-related osteoporosis without current pathological fracture: Secondary | ICD-10-CM | POA: Diagnosis not present

## 2021-07-21 DIAGNOSIS — J45902 Unspecified asthma with status asthmaticus: Secondary | ICD-10-CM | POA: Diagnosis not present

## 2021-07-21 DIAGNOSIS — E559 Vitamin D deficiency, unspecified: Secondary | ICD-10-CM | POA: Diagnosis not present

## 2021-07-21 DIAGNOSIS — E785 Hyperlipidemia, unspecified: Secondary | ICD-10-CM | POA: Diagnosis not present

## 2021-07-21 DIAGNOSIS — Z79899 Other long term (current) drug therapy: Secondary | ICD-10-CM | POA: Diagnosis not present

## 2021-07-28 DIAGNOSIS — M810 Age-related osteoporosis without current pathological fracture: Secondary | ICD-10-CM | POA: Diagnosis not present

## 2021-07-28 DIAGNOSIS — Z6825 Body mass index (BMI) 25.0-25.9, adult: Secondary | ICD-10-CM | POA: Diagnosis not present

## 2021-07-28 DIAGNOSIS — Z0000 Encounter for general adult medical examination without abnormal findings: Secondary | ICD-10-CM | POA: Diagnosis not present

## 2021-07-28 DIAGNOSIS — J45998 Other asthma: Secondary | ICD-10-CM | POA: Diagnosis not present

## 2021-07-28 DIAGNOSIS — E785 Hyperlipidemia, unspecified: Secondary | ICD-10-CM | POA: Diagnosis not present

## 2021-07-29 DIAGNOSIS — Z1283 Encounter for screening for malignant neoplasm of skin: Secondary | ICD-10-CM | POA: Diagnosis not present

## 2021-07-29 DIAGNOSIS — L570 Actinic keratosis: Secondary | ICD-10-CM | POA: Diagnosis not present

## 2021-07-29 DIAGNOSIS — D239 Other benign neoplasm of skin, unspecified: Secondary | ICD-10-CM | POA: Diagnosis not present

## 2021-08-01 DIAGNOSIS — J301 Allergic rhinitis due to pollen: Secondary | ICD-10-CM | POA: Diagnosis not present

## 2021-08-01 DIAGNOSIS — J3081 Allergic rhinitis due to animal (cat) (dog) hair and dander: Secondary | ICD-10-CM | POA: Diagnosis not present

## 2021-08-01 DIAGNOSIS — J3089 Other allergic rhinitis: Secondary | ICD-10-CM | POA: Diagnosis not present

## 2021-08-14 DIAGNOSIS — J3089 Other allergic rhinitis: Secondary | ICD-10-CM | POA: Diagnosis not present

## 2021-08-14 DIAGNOSIS — J3081 Allergic rhinitis due to animal (cat) (dog) hair and dander: Secondary | ICD-10-CM | POA: Diagnosis not present

## 2021-08-14 DIAGNOSIS — J301 Allergic rhinitis due to pollen: Secondary | ICD-10-CM | POA: Diagnosis not present

## 2021-08-15 DIAGNOSIS — J3089 Other allergic rhinitis: Secondary | ICD-10-CM | POA: Diagnosis not present

## 2021-08-15 DIAGNOSIS — J3081 Allergic rhinitis due to animal (cat) (dog) hair and dander: Secondary | ICD-10-CM | POA: Diagnosis not present

## 2021-08-15 DIAGNOSIS — J301 Allergic rhinitis due to pollen: Secondary | ICD-10-CM | POA: Diagnosis not present

## 2021-08-20 DIAGNOSIS — J3081 Allergic rhinitis due to animal (cat) (dog) hair and dander: Secondary | ICD-10-CM | POA: Diagnosis not present

## 2021-08-20 DIAGNOSIS — J3089 Other allergic rhinitis: Secondary | ICD-10-CM | POA: Diagnosis not present

## 2021-08-20 DIAGNOSIS — J301 Allergic rhinitis due to pollen: Secondary | ICD-10-CM | POA: Diagnosis not present

## 2021-08-29 DIAGNOSIS — J301 Allergic rhinitis due to pollen: Secondary | ICD-10-CM | POA: Diagnosis not present

## 2021-08-29 DIAGNOSIS — J3089 Other allergic rhinitis: Secondary | ICD-10-CM | POA: Diagnosis not present

## 2021-08-29 DIAGNOSIS — J3081 Allergic rhinitis due to animal (cat) (dog) hair and dander: Secondary | ICD-10-CM | POA: Diagnosis not present

## 2021-09-04 DIAGNOSIS — J3089 Other allergic rhinitis: Secondary | ICD-10-CM | POA: Diagnosis not present

## 2021-09-04 DIAGNOSIS — J301 Allergic rhinitis due to pollen: Secondary | ICD-10-CM | POA: Diagnosis not present

## 2021-09-04 DIAGNOSIS — J3081 Allergic rhinitis due to animal (cat) (dog) hair and dander: Secondary | ICD-10-CM | POA: Diagnosis not present

## 2021-09-19 DIAGNOSIS — J3081 Allergic rhinitis due to animal (cat) (dog) hair and dander: Secondary | ICD-10-CM | POA: Diagnosis not present

## 2021-09-19 DIAGNOSIS — J301 Allergic rhinitis due to pollen: Secondary | ICD-10-CM | POA: Diagnosis not present

## 2021-09-19 DIAGNOSIS — J3089 Other allergic rhinitis: Secondary | ICD-10-CM | POA: Diagnosis not present

## 2021-10-02 DIAGNOSIS — J3081 Allergic rhinitis due to animal (cat) (dog) hair and dander: Secondary | ICD-10-CM | POA: Diagnosis not present

## 2021-10-02 DIAGNOSIS — J3089 Other allergic rhinitis: Secondary | ICD-10-CM | POA: Diagnosis not present

## 2021-10-02 DIAGNOSIS — J301 Allergic rhinitis due to pollen: Secondary | ICD-10-CM | POA: Diagnosis not present

## 2021-10-17 DIAGNOSIS — J3081 Allergic rhinitis due to animal (cat) (dog) hair and dander: Secondary | ICD-10-CM | POA: Diagnosis not present

## 2021-10-17 DIAGNOSIS — J301 Allergic rhinitis due to pollen: Secondary | ICD-10-CM | POA: Diagnosis not present

## 2021-10-17 DIAGNOSIS — J3089 Other allergic rhinitis: Secondary | ICD-10-CM | POA: Diagnosis not present

## 2021-10-27 DIAGNOSIS — J301 Allergic rhinitis due to pollen: Secondary | ICD-10-CM | POA: Diagnosis not present

## 2021-10-27 DIAGNOSIS — J3089 Other allergic rhinitis: Secondary | ICD-10-CM | POA: Diagnosis not present

## 2021-10-27 DIAGNOSIS — J3081 Allergic rhinitis due to animal (cat) (dog) hair and dander: Secondary | ICD-10-CM | POA: Diagnosis not present

## 2021-11-03 DIAGNOSIS — J387 Other diseases of larynx: Secondary | ICD-10-CM | POA: Diagnosis not present

## 2021-11-03 DIAGNOSIS — R490 Dysphonia: Secondary | ICD-10-CM | POA: Diagnosis not present

## 2021-11-03 DIAGNOSIS — K219 Gastro-esophageal reflux disease without esophagitis: Secondary | ICD-10-CM | POA: Diagnosis not present

## 2021-11-03 DIAGNOSIS — J301 Allergic rhinitis due to pollen: Secondary | ICD-10-CM | POA: Diagnosis not present

## 2021-11-03 DIAGNOSIS — J3089 Other allergic rhinitis: Secondary | ICD-10-CM | POA: Diagnosis not present

## 2021-11-03 DIAGNOSIS — J3081 Allergic rhinitis due to animal (cat) (dog) hair and dander: Secondary | ICD-10-CM | POA: Diagnosis not present

## 2021-11-10 ENCOUNTER — Other Ambulatory Visit (HOSPITAL_COMMUNITY): Payer: Self-pay | Admitting: Internal Medicine

## 2021-11-10 ENCOUNTER — Ambulatory Visit (HOSPITAL_COMMUNITY)
Admission: RE | Admit: 2021-11-10 | Discharge: 2021-11-10 | Disposition: A | Discharge status: Home / Self Care | Payer: Medicare PPO | Source: Ambulatory Visit | Attending: Internal Medicine | Admitting: Internal Medicine

## 2021-11-10 DIAGNOSIS — M9902 Segmental and somatic dysfunction of thoracic region: Secondary | ICD-10-CM | POA: Diagnosis not present

## 2021-11-10 DIAGNOSIS — M545 Low back pain, unspecified: Secondary | ICD-10-CM | POA: Insufficient documentation

## 2021-11-10 DIAGNOSIS — M542 Cervicalgia: Secondary | ICD-10-CM | POA: Diagnosis not present

## 2021-11-10 DIAGNOSIS — M62830 Muscle spasm of back: Secondary | ICD-10-CM | POA: Diagnosis not present

## 2021-11-10 DIAGNOSIS — M9901 Segmental and somatic dysfunction of cervical region: Secondary | ICD-10-CM | POA: Diagnosis not present

## 2021-11-10 DIAGNOSIS — M25551 Pain in right hip: Secondary | ICD-10-CM | POA: Diagnosis not present

## 2021-11-10 DIAGNOSIS — M9905 Segmental and somatic dysfunction of pelvic region: Secondary | ICD-10-CM | POA: Diagnosis not present

## 2021-11-10 DIAGNOSIS — M9903 Segmental and somatic dysfunction of lumbar region: Secondary | ICD-10-CM | POA: Diagnosis not present

## 2021-11-10 DIAGNOSIS — M546 Pain in thoracic spine: Secondary | ICD-10-CM | POA: Diagnosis not present

## 2021-11-14 DIAGNOSIS — J301 Allergic rhinitis due to pollen: Secondary | ICD-10-CM | POA: Diagnosis not present

## 2021-11-14 DIAGNOSIS — J3089 Other allergic rhinitis: Secondary | ICD-10-CM | POA: Diagnosis not present

## 2021-11-14 DIAGNOSIS — J3081 Allergic rhinitis due to animal (cat) (dog) hair and dander: Secondary | ICD-10-CM | POA: Diagnosis not present

## 2021-11-14 IMAGING — MG DIGITAL SCREENING BILAT W/ TOMO W/ CAD
8 series · 9 of 24 positions shown · non-contrast
Comparison: Previous exam(s).

CLINICAL DATA: Screening.

EXAM:
DIGITAL SCREENING BILATERAL MAMMOGRAM WITH TOMO AND CAD

[L MLO synth-2D]
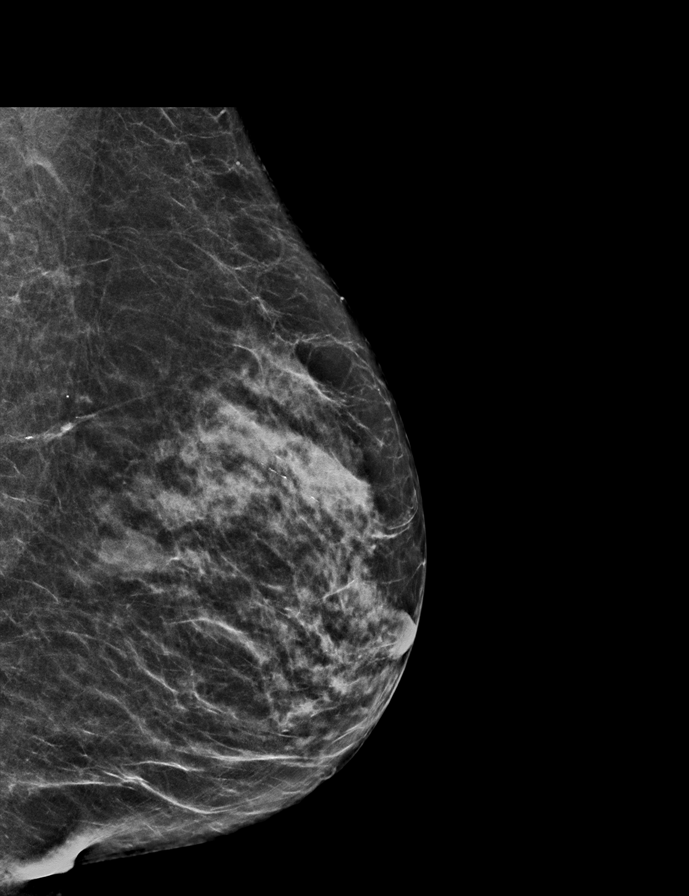

[L CC synth-2D]
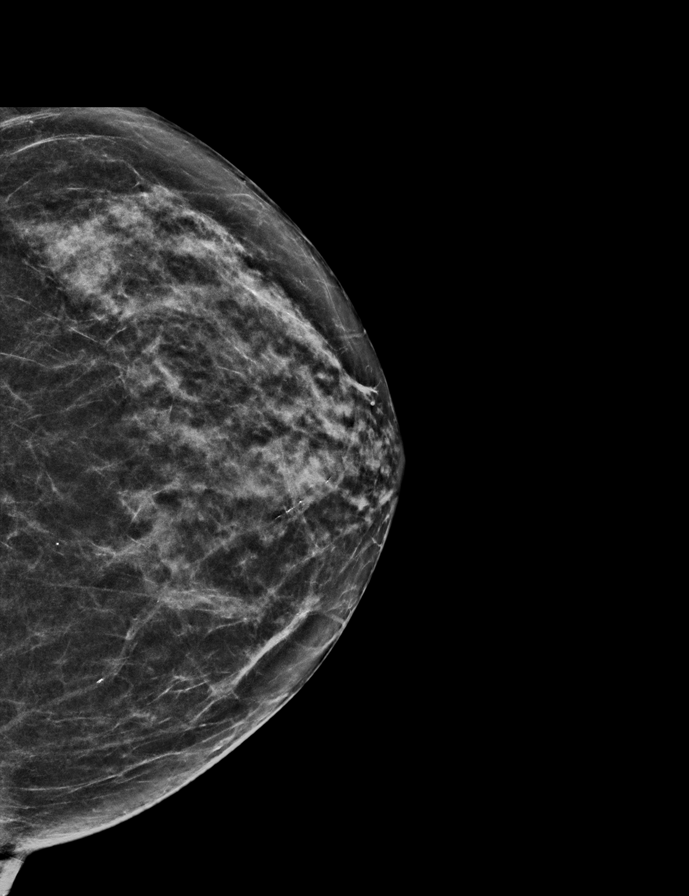

[R CC synth-2D]
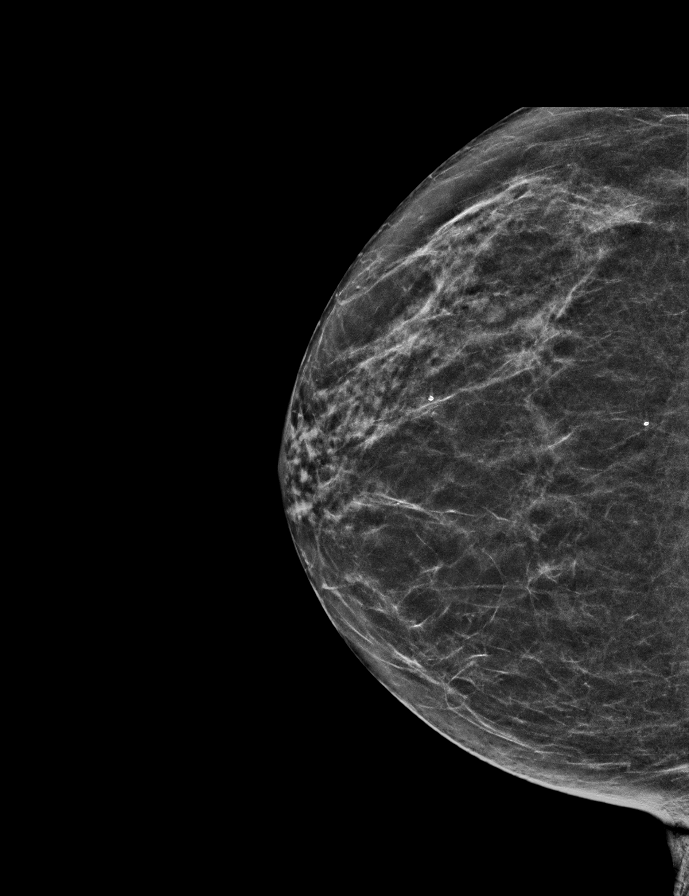

[R MLO synth-2D]
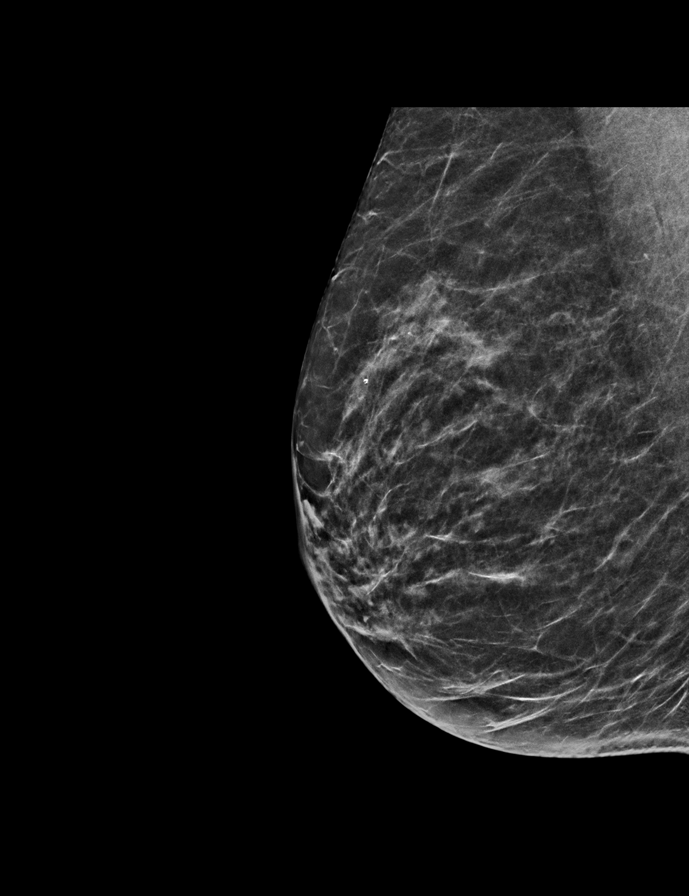

[L MLO tomo · 2 of 57 frames shown]
[frame 19/57]
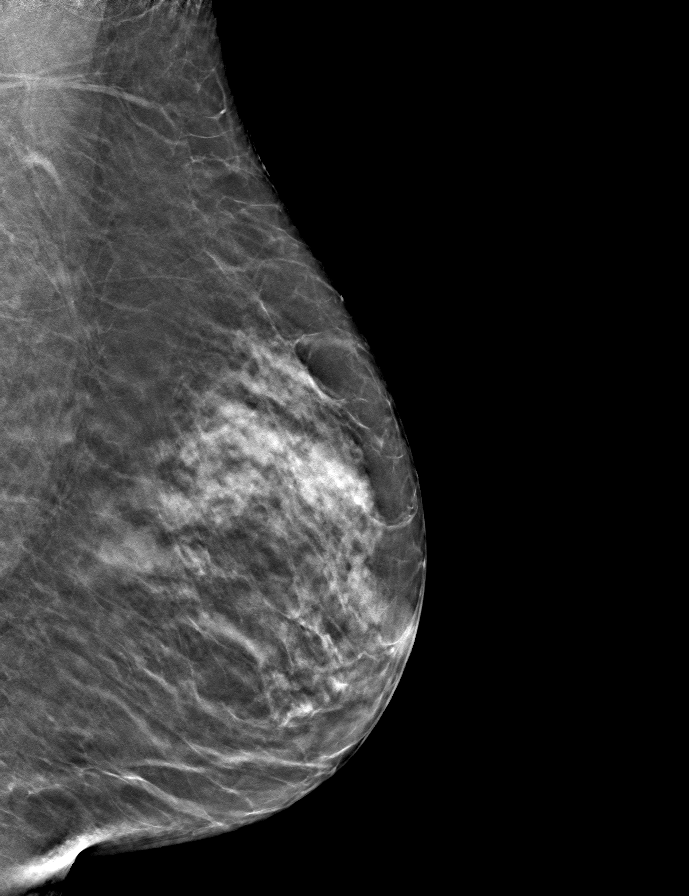
[frame 29/57]
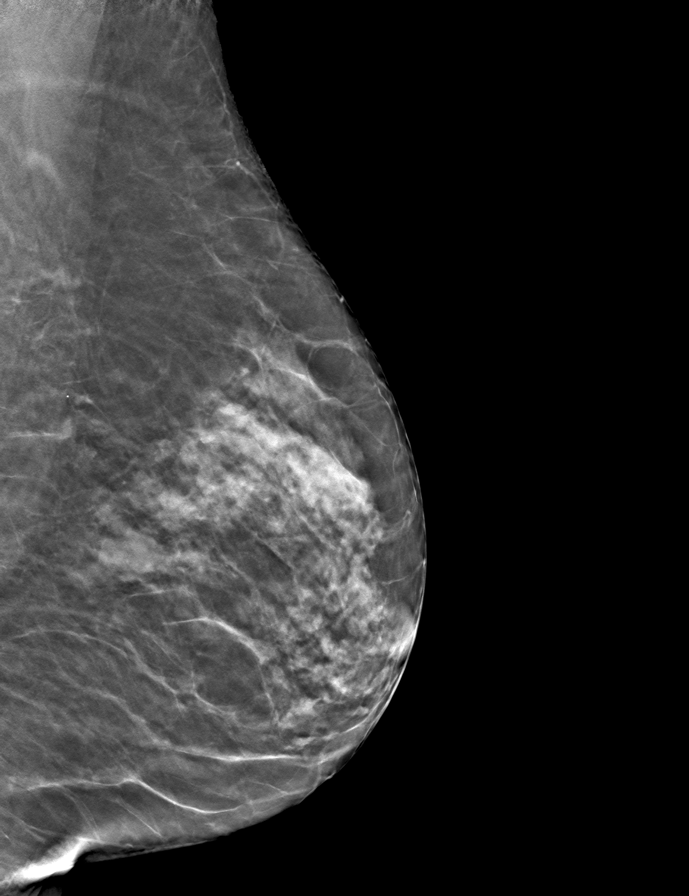

[R CC tomo · tomo slice 26/51.0]
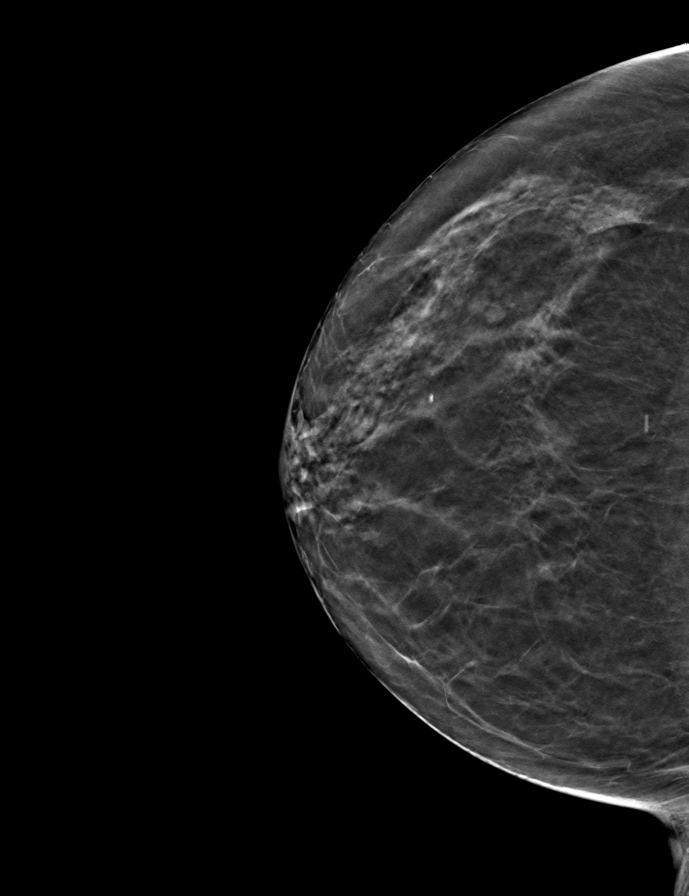

[L CC tomo · tomo slice 27/53.0]
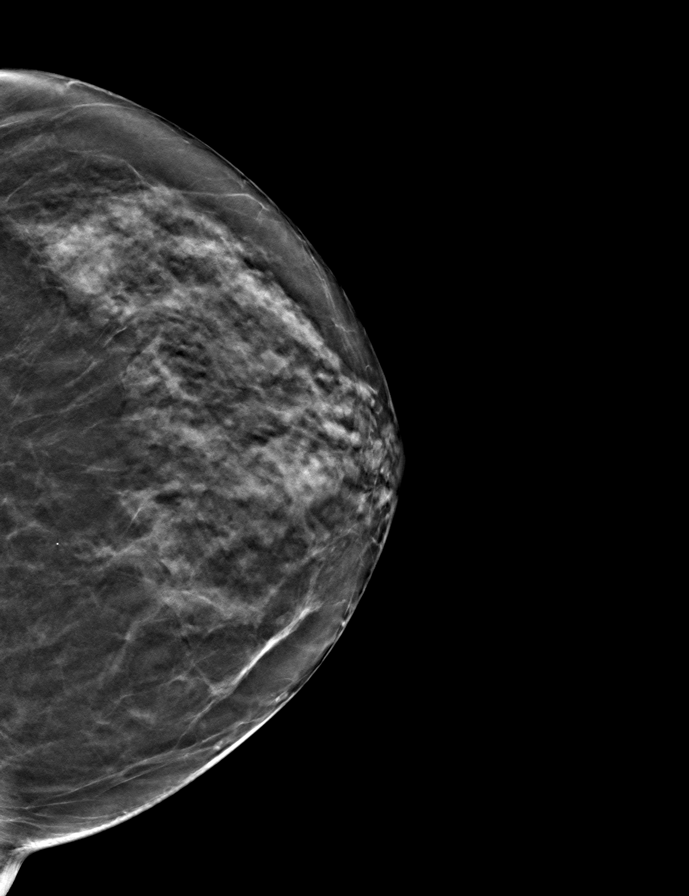

[R MLO tomo · tomo slice 27/52.0]
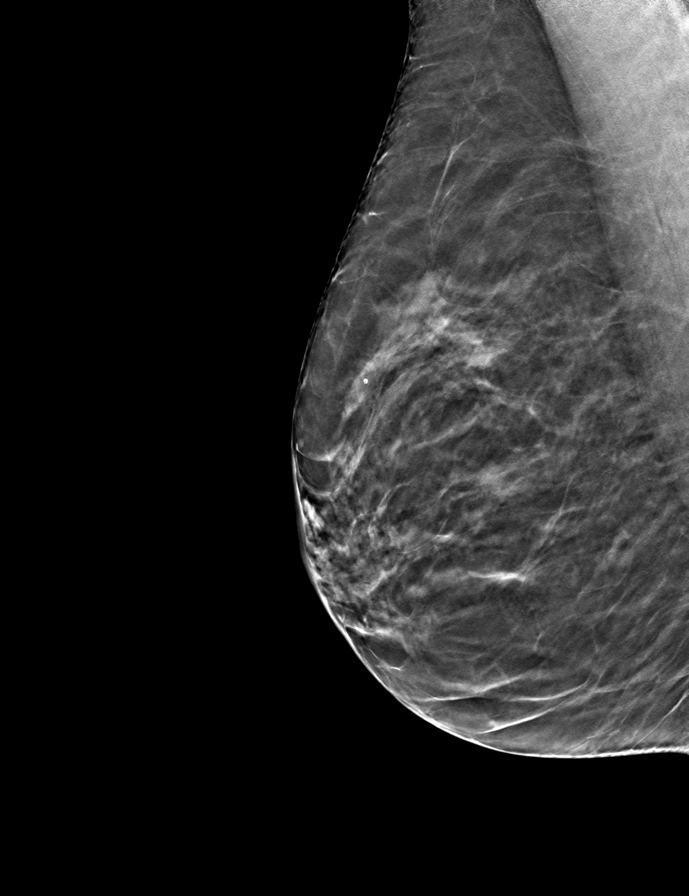

[9 of 24 positions shown; findings below may reference images not displayed]

ACR Breast Density Category c: The breast tissue is heterogeneously
dense, which may obscure small masses.
FINDINGS: There are no findings suspicious for malignancy. Images were
processed with CAD.
IMPRESSION: No mammographic evidence of malignancy. A result letter of this
screening mammogram will be mailed directly to the patient.

RECOMMENDATION:
Screening mammogram in one year. (Code:FT-U-LHB)

BI-RADS CATEGORY  1: Negative.

## 2021-11-17 DIAGNOSIS — M25551 Pain in right hip: Secondary | ICD-10-CM | POA: Diagnosis not present

## 2021-11-17 DIAGNOSIS — M62830 Muscle spasm of back: Secondary | ICD-10-CM | POA: Diagnosis not present

## 2021-11-17 DIAGNOSIS — M9902 Segmental and somatic dysfunction of thoracic region: Secondary | ICD-10-CM | POA: Diagnosis not present

## 2021-11-17 DIAGNOSIS — M542 Cervicalgia: Secondary | ICD-10-CM | POA: Diagnosis not present

## 2021-11-17 DIAGNOSIS — M9905 Segmental and somatic dysfunction of pelvic region: Secondary | ICD-10-CM | POA: Diagnosis not present

## 2021-11-17 DIAGNOSIS — M9901 Segmental and somatic dysfunction of cervical region: Secondary | ICD-10-CM | POA: Diagnosis not present

## 2021-11-17 DIAGNOSIS — M546 Pain in thoracic spine: Secondary | ICD-10-CM | POA: Diagnosis not present

## 2021-11-17 DIAGNOSIS — M9903 Segmental and somatic dysfunction of lumbar region: Secondary | ICD-10-CM | POA: Diagnosis not present

## 2021-11-21 DIAGNOSIS — J3089 Other allergic rhinitis: Secondary | ICD-10-CM | POA: Diagnosis not present

## 2021-11-21 DIAGNOSIS — J3081 Allergic rhinitis due to animal (cat) (dog) hair and dander: Secondary | ICD-10-CM | POA: Diagnosis not present

## 2021-11-21 DIAGNOSIS — J301 Allergic rhinitis due to pollen: Secondary | ICD-10-CM | POA: Diagnosis not present

## 2021-11-24 DIAGNOSIS — M546 Pain in thoracic spine: Secondary | ICD-10-CM | POA: Diagnosis not present

## 2021-11-24 DIAGNOSIS — M9902 Segmental and somatic dysfunction of thoracic region: Secondary | ICD-10-CM | POA: Diagnosis not present

## 2021-11-24 DIAGNOSIS — M25551 Pain in right hip: Secondary | ICD-10-CM | POA: Diagnosis not present

## 2021-11-24 DIAGNOSIS — M9905 Segmental and somatic dysfunction of pelvic region: Secondary | ICD-10-CM | POA: Diagnosis not present

## 2021-11-24 DIAGNOSIS — M62830 Muscle spasm of back: Secondary | ICD-10-CM | POA: Diagnosis not present

## 2021-11-24 DIAGNOSIS — M542 Cervicalgia: Secondary | ICD-10-CM | POA: Diagnosis not present

## 2021-11-24 DIAGNOSIS — M9901 Segmental and somatic dysfunction of cervical region: Secondary | ICD-10-CM | POA: Diagnosis not present

## 2021-11-24 DIAGNOSIS — M9903 Segmental and somatic dysfunction of lumbar region: Secondary | ICD-10-CM | POA: Diagnosis not present

## 2021-12-03 DIAGNOSIS — J3081 Allergic rhinitis due to animal (cat) (dog) hair and dander: Secondary | ICD-10-CM | POA: Diagnosis not present

## 2021-12-03 DIAGNOSIS — J301 Allergic rhinitis due to pollen: Secondary | ICD-10-CM | POA: Diagnosis not present

## 2021-12-03 DIAGNOSIS — J3089 Other allergic rhinitis: Secondary | ICD-10-CM | POA: Diagnosis not present

## 2021-12-05 DIAGNOSIS — H2513 Age-related nuclear cataract, bilateral: Secondary | ICD-10-CM | POA: Diagnosis not present

## 2021-12-05 DIAGNOSIS — H18603 Keratoconus, unspecified, bilateral: Secondary | ICD-10-CM | POA: Diagnosis not present

## 2021-12-05 DIAGNOSIS — H02403 Unspecified ptosis of bilateral eyelids: Secondary | ICD-10-CM | POA: Diagnosis not present

## 2021-12-12 DIAGNOSIS — J3089 Other allergic rhinitis: Secondary | ICD-10-CM | POA: Diagnosis not present

## 2021-12-12 DIAGNOSIS — J3081 Allergic rhinitis due to animal (cat) (dog) hair and dander: Secondary | ICD-10-CM | POA: Diagnosis not present

## 2021-12-12 DIAGNOSIS — J301 Allergic rhinitis due to pollen: Secondary | ICD-10-CM | POA: Diagnosis not present

## 2022-01-02 DIAGNOSIS — J3089 Other allergic rhinitis: Secondary | ICD-10-CM | POA: Diagnosis not present

## 2022-01-02 DIAGNOSIS — J3081 Allergic rhinitis due to animal (cat) (dog) hair and dander: Secondary | ICD-10-CM | POA: Diagnosis not present

## 2022-01-02 DIAGNOSIS — J301 Allergic rhinitis due to pollen: Secondary | ICD-10-CM | POA: Diagnosis not present

## 2022-01-08 DIAGNOSIS — K219 Gastro-esophageal reflux disease without esophagitis: Secondary | ICD-10-CM | POA: Diagnosis not present

## 2022-01-08 DIAGNOSIS — Z23 Encounter for immunization: Secondary | ICD-10-CM | POA: Diagnosis not present

## 2022-01-08 DIAGNOSIS — J383 Other diseases of vocal cords: Secondary | ICD-10-CM | POA: Diagnosis not present

## 2022-01-08 DIAGNOSIS — R499 Unspecified voice and resonance disorder: Secondary | ICD-10-CM | POA: Diagnosis not present

## 2022-01-08 DIAGNOSIS — R490 Dysphonia: Secondary | ICD-10-CM | POA: Diagnosis not present

## 2022-01-08 DIAGNOSIS — J387 Other diseases of larynx: Secondary | ICD-10-CM | POA: Diagnosis not present

## 2022-01-23 DIAGNOSIS — J3089 Other allergic rhinitis: Secondary | ICD-10-CM | POA: Diagnosis not present

## 2022-01-23 DIAGNOSIS — J301 Allergic rhinitis due to pollen: Secondary | ICD-10-CM | POA: Diagnosis not present

## 2022-01-23 DIAGNOSIS — J3081 Allergic rhinitis due to animal (cat) (dog) hair and dander: Secondary | ICD-10-CM | POA: Diagnosis not present

## 2022-02-06 DIAGNOSIS — J301 Allergic rhinitis due to pollen: Secondary | ICD-10-CM | POA: Diagnosis not present

## 2022-02-06 DIAGNOSIS — J3081 Allergic rhinitis due to animal (cat) (dog) hair and dander: Secondary | ICD-10-CM | POA: Diagnosis not present

## 2022-02-06 DIAGNOSIS — J3089 Other allergic rhinitis: Secondary | ICD-10-CM | POA: Diagnosis not present

## 2022-02-23 DIAGNOSIS — J4530 Mild persistent asthma, uncomplicated: Secondary | ICD-10-CM | POA: Diagnosis not present

## 2022-02-23 DIAGNOSIS — J301 Allergic rhinitis due to pollen: Secondary | ICD-10-CM | POA: Diagnosis not present

## 2022-02-23 DIAGNOSIS — J3081 Allergic rhinitis due to animal (cat) (dog) hair and dander: Secondary | ICD-10-CM | POA: Diagnosis not present

## 2022-02-23 DIAGNOSIS — J3089 Other allergic rhinitis: Secondary | ICD-10-CM | POA: Diagnosis not present

## 2022-03-13 DIAGNOSIS — J3089 Other allergic rhinitis: Secondary | ICD-10-CM | POA: Diagnosis not present

## 2022-03-13 DIAGNOSIS — J3081 Allergic rhinitis due to animal (cat) (dog) hair and dander: Secondary | ICD-10-CM | POA: Diagnosis not present

## 2022-03-13 DIAGNOSIS — J301 Allergic rhinitis due to pollen: Secondary | ICD-10-CM | POA: Diagnosis not present

## 2022-03-20 DIAGNOSIS — J3089 Other allergic rhinitis: Secondary | ICD-10-CM | POA: Diagnosis not present

## 2022-03-20 DIAGNOSIS — J301 Allergic rhinitis due to pollen: Secondary | ICD-10-CM | POA: Diagnosis not present

## 2022-03-20 DIAGNOSIS — J3081 Allergic rhinitis due to animal (cat) (dog) hair and dander: Secondary | ICD-10-CM | POA: Diagnosis not present

## 2022-03-27 DIAGNOSIS — J3089 Other allergic rhinitis: Secondary | ICD-10-CM | POA: Diagnosis not present

## 2022-03-27 DIAGNOSIS — J301 Allergic rhinitis due to pollen: Secondary | ICD-10-CM | POA: Diagnosis not present

## 2022-03-27 DIAGNOSIS — J3081 Allergic rhinitis due to animal (cat) (dog) hair and dander: Secondary | ICD-10-CM | POA: Diagnosis not present

## 2022-04-01 DIAGNOSIS — J3089 Other allergic rhinitis: Secondary | ICD-10-CM | POA: Diagnosis not present

## 2022-04-01 DIAGNOSIS — J3081 Allergic rhinitis due to animal (cat) (dog) hair and dander: Secondary | ICD-10-CM | POA: Diagnosis not present

## 2022-04-01 DIAGNOSIS — J301 Allergic rhinitis due to pollen: Secondary | ICD-10-CM | POA: Diagnosis not present

## 2022-04-09 DIAGNOSIS — J3089 Other allergic rhinitis: Secondary | ICD-10-CM | POA: Diagnosis not present

## 2022-04-09 DIAGNOSIS — J301 Allergic rhinitis due to pollen: Secondary | ICD-10-CM | POA: Diagnosis not present

## 2022-04-09 DIAGNOSIS — J3081 Allergic rhinitis due to animal (cat) (dog) hair and dander: Secondary | ICD-10-CM | POA: Diagnosis not present

## 2022-04-16 ENCOUNTER — Other Ambulatory Visit (HOSPITAL_COMMUNITY): Payer: Self-pay | Admitting: Internal Medicine

## 2022-04-16 DIAGNOSIS — Z1231 Encounter for screening mammogram for malignant neoplasm of breast: Secondary | ICD-10-CM

## 2022-04-17 DIAGNOSIS — J3089 Other allergic rhinitis: Secondary | ICD-10-CM | POA: Diagnosis not present

## 2022-04-17 DIAGNOSIS — J301 Allergic rhinitis due to pollen: Secondary | ICD-10-CM | POA: Diagnosis not present

## 2022-04-17 DIAGNOSIS — J3081 Allergic rhinitis due to animal (cat) (dog) hair and dander: Secondary | ICD-10-CM | POA: Diagnosis not present

## 2022-05-04 ENCOUNTER — Ambulatory Visit (HOSPITAL_COMMUNITY)
Admission: RE | Admit: 2022-05-04 | Discharge: 2022-05-04 | Disposition: A | Discharge status: Home / Self Care | Payer: Medicare PPO | Source: Ambulatory Visit | Attending: Internal Medicine | Admitting: Internal Medicine

## 2022-05-04 DIAGNOSIS — Z1231 Encounter for screening mammogram for malignant neoplasm of breast: Secondary | ICD-10-CM | POA: Diagnosis not present

## 2022-05-05 ENCOUNTER — Other Ambulatory Visit (HOSPITAL_COMMUNITY): Payer: Self-pay | Admitting: Internal Medicine

## 2022-05-05 DIAGNOSIS — R928 Other abnormal and inconclusive findings on diagnostic imaging of breast: Secondary | ICD-10-CM

## 2022-05-08 DIAGNOSIS — J301 Allergic rhinitis due to pollen: Secondary | ICD-10-CM | POA: Diagnosis not present

## 2022-05-08 DIAGNOSIS — J3081 Allergic rhinitis due to animal (cat) (dog) hair and dander: Secondary | ICD-10-CM | POA: Diagnosis not present

## 2022-05-08 DIAGNOSIS — J3089 Other allergic rhinitis: Secondary | ICD-10-CM | POA: Diagnosis not present

## 2022-05-15 DIAGNOSIS — J3089 Other allergic rhinitis: Secondary | ICD-10-CM | POA: Diagnosis not present

## 2022-05-15 DIAGNOSIS — J3081 Allergic rhinitis due to animal (cat) (dog) hair and dander: Secondary | ICD-10-CM | POA: Diagnosis not present

## 2022-05-15 DIAGNOSIS — J301 Allergic rhinitis due to pollen: Secondary | ICD-10-CM | POA: Diagnosis not present

## 2022-05-18 DIAGNOSIS — Z1211 Encounter for screening for malignant neoplasm of colon: Secondary | ICD-10-CM | POA: Diagnosis not present

## 2022-05-18 DIAGNOSIS — D123 Benign neoplasm of transverse colon: Secondary | ICD-10-CM | POA: Diagnosis not present

## 2022-05-18 DIAGNOSIS — D127 Benign neoplasm of rectosigmoid junction: Secondary | ICD-10-CM | POA: Diagnosis not present

## 2022-05-18 DIAGNOSIS — D122 Benign neoplasm of ascending colon: Secondary | ICD-10-CM | POA: Diagnosis not present

## 2022-05-18 DIAGNOSIS — K635 Polyp of colon: Secondary | ICD-10-CM | POA: Diagnosis not present

## 2022-05-18 DIAGNOSIS — D125 Benign neoplasm of sigmoid colon: Secondary | ICD-10-CM | POA: Diagnosis not present

## 2022-05-18 DIAGNOSIS — D120 Benign neoplasm of cecum: Secondary | ICD-10-CM | POA: Diagnosis not present

## 2022-05-18 DIAGNOSIS — D124 Benign neoplasm of descending colon: Secondary | ICD-10-CM | POA: Diagnosis not present

## 2022-05-22 DIAGNOSIS — J3089 Other allergic rhinitis: Secondary | ICD-10-CM | POA: Diagnosis not present

## 2022-05-22 DIAGNOSIS — J3081 Allergic rhinitis due to animal (cat) (dog) hair and dander: Secondary | ICD-10-CM | POA: Diagnosis not present

## 2022-05-22 DIAGNOSIS — J301 Allergic rhinitis due to pollen: Secondary | ICD-10-CM | POA: Diagnosis not present

## 2022-05-26 ENCOUNTER — Ambulatory Visit (HOSPITAL_COMMUNITY)
Admission: RE | Admit: 2022-05-26 | Discharge: 2022-05-26 | Disposition: A | Discharge status: Home / Self Care | Payer: Medicare PPO | Source: Ambulatory Visit | Attending: Internal Medicine | Admitting: Internal Medicine

## 2022-05-26 DIAGNOSIS — R928 Other abnormal and inconclusive findings on diagnostic imaging of breast: Secondary | ICD-10-CM | POA: Diagnosis not present

## 2022-05-29 DIAGNOSIS — J3089 Other allergic rhinitis: Secondary | ICD-10-CM | POA: Diagnosis not present

## 2022-05-29 DIAGNOSIS — J3081 Allergic rhinitis due to animal (cat) (dog) hair and dander: Secondary | ICD-10-CM | POA: Diagnosis not present

## 2022-05-29 DIAGNOSIS — J301 Allergic rhinitis due to pollen: Secondary | ICD-10-CM | POA: Diagnosis not present

## 2022-06-05 DIAGNOSIS — J3089 Other allergic rhinitis: Secondary | ICD-10-CM | POA: Diagnosis not present

## 2022-06-05 DIAGNOSIS — J301 Allergic rhinitis due to pollen: Secondary | ICD-10-CM | POA: Diagnosis not present

## 2022-06-05 DIAGNOSIS — J3081 Allergic rhinitis due to animal (cat) (dog) hair and dander: Secondary | ICD-10-CM | POA: Diagnosis not present

## 2022-06-12 DIAGNOSIS — J301 Allergic rhinitis due to pollen: Secondary | ICD-10-CM | POA: Diagnosis not present

## 2022-06-12 DIAGNOSIS — J3089 Other allergic rhinitis: Secondary | ICD-10-CM | POA: Diagnosis not present

## 2022-06-12 DIAGNOSIS — J3081 Allergic rhinitis due to animal (cat) (dog) hair and dander: Secondary | ICD-10-CM | POA: Diagnosis not present

## 2022-06-19 DIAGNOSIS — J3089 Other allergic rhinitis: Secondary | ICD-10-CM | POA: Diagnosis not present

## 2022-06-19 DIAGNOSIS — J301 Allergic rhinitis due to pollen: Secondary | ICD-10-CM | POA: Diagnosis not present

## 2022-06-19 DIAGNOSIS — J3081 Allergic rhinitis due to animal (cat) (dog) hair and dander: Secondary | ICD-10-CM | POA: Diagnosis not present

## 2022-06-26 DIAGNOSIS — J301 Allergic rhinitis due to pollen: Secondary | ICD-10-CM | POA: Diagnosis not present

## 2022-06-26 DIAGNOSIS — J3081 Allergic rhinitis due to animal (cat) (dog) hair and dander: Secondary | ICD-10-CM | POA: Diagnosis not present

## 2022-06-26 DIAGNOSIS — J3089 Other allergic rhinitis: Secondary | ICD-10-CM | POA: Diagnosis not present

## 2022-07-01 DIAGNOSIS — Z124 Encounter for screening for malignant neoplasm of cervix: Secondary | ICD-10-CM | POA: Diagnosis not present

## 2022-07-01 DIAGNOSIS — M810 Age-related osteoporosis without current pathological fracture: Secondary | ICD-10-CM | POA: Diagnosis not present

## 2022-07-01 DIAGNOSIS — N9089 Other specified noninflammatory disorders of vulva and perineum: Secondary | ICD-10-CM | POA: Diagnosis not present

## 2022-07-01 DIAGNOSIS — Z1211 Encounter for screening for malignant neoplasm of colon: Secondary | ICD-10-CM | POA: Diagnosis not present

## 2022-07-01 DIAGNOSIS — Z1239 Encounter for other screening for malignant neoplasm of breast: Secondary | ICD-10-CM | POA: Diagnosis not present

## 2022-07-01 DIAGNOSIS — N952 Postmenopausal atrophic vaginitis: Secondary | ICD-10-CM | POA: Diagnosis not present

## 2022-07-01 DIAGNOSIS — Z6826 Body mass index (BMI) 26.0-26.9, adult: Secondary | ICD-10-CM | POA: Diagnosis not present

## 2022-07-03 DIAGNOSIS — J3089 Other allergic rhinitis: Secondary | ICD-10-CM | POA: Diagnosis not present

## 2022-07-03 DIAGNOSIS — J3081 Allergic rhinitis due to animal (cat) (dog) hair and dander: Secondary | ICD-10-CM | POA: Diagnosis not present

## 2022-07-03 DIAGNOSIS — J301 Allergic rhinitis due to pollen: Secondary | ICD-10-CM | POA: Diagnosis not present

## 2022-07-17 DIAGNOSIS — J3081 Allergic rhinitis due to animal (cat) (dog) hair and dander: Secondary | ICD-10-CM | POA: Diagnosis not present

## 2022-07-17 DIAGNOSIS — J301 Allergic rhinitis due to pollen: Secondary | ICD-10-CM | POA: Diagnosis not present

## 2022-07-17 DIAGNOSIS — J3089 Other allergic rhinitis: Secondary | ICD-10-CM | POA: Diagnosis not present

## 2022-08-09 ENCOUNTER — Ambulatory Visit (INDEPENDENT_AMBULATORY_CARE_PROVIDER_SITE_OTHER): Payer: Medicare PPO

## 2022-08-09 ENCOUNTER — Ambulatory Visit
Admission: EM | Admit: 2022-08-09 | Discharge: 2022-08-09 | Disposition: A | Discharge status: Home / Self Care | Payer: Medicare PPO | Attending: Nurse Practitioner | Admitting: Nurse Practitioner

## 2022-08-09 DIAGNOSIS — R112 Nausea with vomiting, unspecified: Secondary | ICD-10-CM | POA: Diagnosis not present

## 2022-08-09 DIAGNOSIS — R0789 Other chest pain: Secondary | ICD-10-CM | POA: Insufficient documentation

## 2022-08-09 DIAGNOSIS — R42 Dizziness and giddiness: Secondary | ICD-10-CM | POA: Insufficient documentation

## 2022-08-09 DIAGNOSIS — Z1152 Encounter for screening for COVID-19: Secondary | ICD-10-CM | POA: Diagnosis not present

## 2022-08-09 DIAGNOSIS — R198 Other specified symptoms and signs involving the digestive system and abdomen: Secondary | ICD-10-CM | POA: Diagnosis not present

## 2022-08-09 DIAGNOSIS — R079 Chest pain, unspecified: Secondary | ICD-10-CM | POA: Diagnosis not present

## 2022-08-09 MED ORDER — PANTOPRAZOLE SODIUM 40 MG PO TBEC: 40.0000 mg | DELAYED_RELEASE_TABLET | Freq: Every day | ORAL | 0 refills | Status: AC

## 2022-08-09 MED ORDER — ALUM & MAG HYDROXIDE-SIMETH 200-200-20 MG/5ML PO SUSP
30.0000 mL | Freq: Once | ORAL | Status: AC
Start: 2022-08-09 — End: 2022-08-09
  Administered 2022-08-09: 30 mL via ORAL

## 2022-08-09 MED ORDER — LIDOCAINE VISCOUS HCL 2 % MT SOLN
15.0000 mL | Freq: Once | OROMUCOSAL | Status: AC
Start: 2022-08-09 — End: 2022-08-09
  Administered 2022-08-09: 15 mL via OROMUCOSAL

## 2022-08-09 MED ORDER — MYLANTA MAXIMUM STRENGTH 400-400-40 MG/5ML PO SUSP: 15.0000 mL | Freq: Four times a day (QID) | ORAL | 0 refills | Status: AC | PRN

## 2022-08-09 MED ORDER — ONDANSETRON 4 MG PO TBDP
4.0000 mg | ORAL_TABLET | Freq: Once | ORAL | Status: AC
  Administered 2022-08-09: 4 mg via ORAL

## 2022-08-09 MED ORDER — ONDANSETRON 4 MG PO TBDP: 4.0000 mg | ORAL_TABLET | Freq: Three times a day (TID) | ORAL | 0 refills | Status: AC | PRN

## 2022-08-09 NOTE — ED Provider Notes (Addendum)
[facility]    CSN: [id number] Arrival date & time: 08/09/22  1191      History   Chief Complaint No chief complaint on file.   HPI Yesenia Welch is a 72 y.o. female.   The history is provided by the patient.   The patient presents for complaints of nausea with chest pain that started around 4 AM this morning.  Patient describes her pain as "pressure".  She states at 1 point she did feel like the pain radiated down the left arm.  She states compared to when it started, it has improved.  Patient also complains of chills, lightheadedness, indigestion, and pain across her shoulder blades.  Patient reports that she and her husband recently returned from a trip from [place] as of yesterday.  She states that they had several layovers and long flights.  Patient states that when she got home, she felt well.  Patient states that she has been dry heaving all morning.  She also states that she has been belching, reporting that she had "3 good burps".  Patient denies fever, chills, shortness of breath, difficulty breathing, abdominal pain, constipation, or pain that radiates into her jaw.  Patient denies history of cardiac disease.  Patient spouse states that his wife has been taking his famotidine for indigestion over the last several months.  Patient denies history of smoking. After review of the patient's chart, patient does have a history of chest discomfort dating back to 2011.  Workup was completed, and it was found that she has low risk for " obstructive coronary disease).   Past Medical History:  Diagnosis Date   Asthma    Keratoconus of both eyes    Osteopenia    PONV (postoperative nausea and vomiting)     Patient Active Problem List   Diagnosis Date Noted   Right ovarian cyst 03/22/2018   OVERWEIGHT 06/05/2009   CHEST DISCOMFORT 06/05/2009   ABNORMAL ELECTROCARDIOGRAM 06/05/2009    Past Surgical History:  Procedure Laterality Date   BREAST BIOPSY Left 2003    Benign   BREAST LUMPECTOMY  2003   LEFT BREAST   CESAREAN SECTION     October 1975, March 1977   CHOLECYSTECTOMY  1996   COLONOSCOPY  02/12/2012   DILATION AND CURETTAGE OF UTERUS  1973   ENDOMETRIAL ABLATION  2006   UTERINE   ROBOTIC ASSISTED BILATERAL SALPINGO OOPHERECTOMY Bilateral 03/22/2018   Procedure: XI ROBOTIC ASSISTED BILATERAL SALPINGO OOPHORECTOMY WITH PERITONEAL WASHINGS;  Surgeon: Adolphus Birchwood, MD;  Location: [facility];  Service: Gynecology;  Laterality: Bilateral;   SALPINGOOPHORECTOMY     bil Dr. Andrey Farmer   03-22-18   TONSILLECTOMY  1956    OB History   No obstetric history on file.      Home Medications    Prior to Admission medications   Medication Sig Start Date End Date Taking? Authorizing Provider  alum & mag hydroxide-simeth (MYLANTA MAXIMUM STRENGTH) 400-400-40 MG/5ML suspension Take 15 mLs by mouth every 6 (six) hours as needed for indigestion. 08/09/22  Yes Shenaya Lebo-Warren, Sadie Haber, NP  ondansetron (ZOFRAN-ODT) 4 MG disintegrating tablet Take 1 tablet (4 mg total) by mouth every 8 (eight) hours as needed for nausea or vomiting. 08/09/22  Yes Dezaray Shibuya-Warren, Sadie Haber, NP  pantoprazole (PROTONIX) 40 MG tablet Take 1 tablet (40 mg total) by mouth daily. 08/09/22  Yes Salli Bodin-Warren, Sadie Haber, NP  albuterol (PROVENTIL HFA;VENTOLIN HFA) 108 (90 Base) MCG/ACT inhaler Inhale 1-2 puffs into the lungs as needed  for wheezing or shortness of breath.    [provider]  alendronate (FOSAMAX) 70 MG tablet  01/23/20   [provider]  benzonatate (TESSALON) 100 MG capsule Take 1-2 capsules (100-200 mg total) by mouth 3 (three) times daily as needed for cough. 05/18/21   Wallis Bamberg, PA-C  cetirizine (ZYRTEC) 10 MG tablet Take 10 mg by mouth daily.    [provider]  Estradiol 10 MCG TABS vaginal tablet Imvexxy Maintenance Pack 10 mcg vaginal insert  Insert 1 vaginal insert twice a week by vaginal route.    [provider]  fluticasone (FLONASE)  50 MCG/ACT nasal spray Place 2 sprays into both nostrils daily.    [provider]  fluticasone furoate-vilanterol (BREO ELLIPTA) 200-25 MCG/ACT AEPB 1 puff 05/16/21   [provider]  fluticasone furoate-vilanterol (BREO ELLIPTA) 200-25 MCG/INH AEPB Inhale 1 puff into the lungs daily.     [provider]  predniSONE (DELTASONE) 50 MG tablet Take 1 tablet (50 mg total) by mouth daily with breakfast. 05/18/21   Wallis Bamberg, PA-C  promethazine-dextromethorphan (PROMETHAZINE-DM) 6.25-15 MG/5ML syrup Take 5 mLs by mouth at bedtime as needed for cough. 05/18/21   Wallis Bamberg, PA-C  triamcinolone ointment (KENALOG) 0.1 % triamcinolone acetonide 0.1 % topical ointment  APPLY A THIN LAYER TO THE AFFECTED AREA(S) BY TOPICAL ROUTE 2 TIMES PER DAY x 7 days    [provider]    Family History Family History  Problem Relation Age of Onset   Heart attack Father        CABG @ AGE 33   Ovarian cancer Paternal Aunt     Social History Social History   Tobacco Use   Smoking status: Never   Smokeless tobacco: Never  Vaping Use   Vaping Use: Never used  Substance Use Topics   Alcohol use: Yes    Alcohol/week: 1.0 standard drink of alcohol    Types: 1 Glasses of wine per week    Comment: daily   Drug use: No     Allergies   Dust mite extract, Gluten meal, and Penicillins   Review of Systems Review of Systems Per HPI  Physical Exam Triage Vital Signs ED Triage Vitals  Enc Vitals Group     BP 08/09/22 0915 136/89     Pulse Rate 08/09/22 0915 96     Resp 08/09/22 0915 18     Temp 08/09/22 0915 99 F (37.2 C)     Temp Source 08/09/22 0915 Oral     SpO2 08/09/22 0915 98 %     Weight --      Height --      Head Circumference --      Peak Flow --      Pain Score 08/09/22 0917 7     Pain Loc --      Pain Edu? --      Excl. in GC? --    No data found.  Updated Vital Signs BP 136/89 (BP Location: Right Arm)   Pulse 96   Temp 99 F (37.2 C) (Oral)    Resp 18   SpO2 98%   Visual Acuity Right Eye Distance:   Left Eye Distance:   Bilateral Distance:    Right Eye Near:   Left Eye Near:    Bilateral Near:     Physical Exam Vitals and nursing note reviewed.  Constitutional:      General: She is not in acute distress.    Appearance:  Normal appearance.  HENT:     Head: Normocephalic.  Eyes:     Extraocular Movements: Extraocular movements intact.     Conjunctiva/sclera: Conjunctivae normal.     Pupils: Pupils are equal, round, and reactive to light.  Cardiovascular:     Rate and Rhythm: Normal rate and regular rhythm.     Pulses: Normal pulses.     Heart sounds: Normal heart sounds.  Pulmonary:     Effort: Pulmonary effort is normal. No respiratory distress.     Breath sounds: Normal breath sounds. No stridor. No wheezing, rhonchi or rales.  Chest:     Chest wall: Tenderness present.    Abdominal:     General: Bowel sounds are normal.     Palpations: Abdomen is soft.     Tenderness: There is no abdominal tenderness.     Comments: Patient with vomiting when attempted to provide GI cocktail.  Patient reports that she feels better after vomiting.   Zofran 4 mg ODT was administered.  Patient was able to tolerate GI cocktail.  Patient reports "I feel so much better" after the GI cocktail.  Musculoskeletal:     Cervical back: Normal range of motion.  Lymphadenopathy:     Cervical: No cervical adenopathy.  Skin:    General: Skin is warm and dry.  Neurological:     General: No focal deficit present.     Mental Status: She is alert and oriented to person, place, and time.     GCS: GCS eye subscore is 4. GCS verbal subscore is 5. GCS motor subscore is 6.     Cranial Nerves: Cranial nerves 2-12 are intact.     Motor: Motor function is intact.     Coordination: Coordination is intact.     Gait: Gait is intact.  Psychiatric:        Mood and Affect: Mood normal.        Behavior: Behavior normal.      UC Treatments /  Results  Labs (all labs ordered are listed, but only abnormal results are displayed) Labs Reviewed - No data to display  EKG: NSR, no STEMI, compared to EKG performed in 2011.   Radiology DG Chest 2 View  Result Date: 08/09/2022 CLINICAL DATA:  Chest pain.  History of pneumonia. EXAM: CHEST - 2 VIEW COMPARISON:  None Available. FINDINGS: The heart size and mediastinal contours are within normal limits. Both lungs are clear. The visualized skeletal structures are unremarkable. IMPRESSION: No active cardiopulmonary disease. Electronically Signed   By: Gerome Sam III M.D.   On: 08/09/2022 10:01    Procedures Procedures (including critical care time)  Medications Ordered in UC Medications  alum & mag hydroxide-simeth (MAALOX/MYLANTA) 200-200-20 MG/5ML suspension 30 mL (30 mLs Oral Given 08/09/22 0939)  lidocaine (XYLOCAINE) 2 % viscous mouth solution 15 mL (15 mLs Mouth/Throat Given 08/09/22 0939)  ondansetron (ZOFRAN-ODT) disintegrating tablet 4 mg (4 mg Oral Given 08/09/22 0931)    Initial Impression / Assessment and Plan / UC Course  I have reviewed the triage vital signs and the nursing notes.  Pertinent labs & imaging results that were available during my care of the patient were reviewed by me and considered in my medical decision making (see chart for details).  Patient presents for complaints of chest pain, lightheadedness, indigestion, and chills.  EKG was performed which showed normal sinus rhythm, no STEMI.  EKG was compared to EKG performed in 2011.  This x-ray was negative for active cardiopulmonary disease.  Patient declined lab work today as she states she has an upcoming appointment. COVID test is pending.  Patient is a candidate to receive molnupiravir if her COVID test is positive.  Differential diagnoses include GERD, MI, and PE.  Patient was administered a GI cocktail, and experienced significant relief.  Symptoms most likely were related to reflux symptoms.  Will start  patient on Protonix 40 mg daily for reflux symptoms, ondansetron 4 mg for nausea, and Mylanta maximum strength 400-400-40mg  for indigestion as needed.  Supportive care recommendations were provided and discussed with the patient to include increasing fluids, allowing for plenty of rest, and foods to avoid while symptoms persist.  Patient was given strict ER follow-up precautions.  Patient was also advised to reach out to her physician's office to advise of the symptoms that she experienced, and to keep her upcoming scheduled appointment.  Patient and spouse are in agreement with this plan of care and verbalized understanding.  All questions were answered.  Patient stable for discharge.   Final Clinical Impressions(s) / UC Diagnoses   Final diagnoses:  Other chest pain  Symptoms of gastroesophageal reflux  Lightheadedness  Nausea and vomiting, unspecified vomiting type     Discharge Instructions      Take medication as prescribed. Increase fluids and allow for plenty of rest. May take over-the-counter Tylenol as needed for pain, fever, general discomfort. Recommend a bland diet today.  I would avoid foods high in sodium, tomato-based foods, spicy foods, caffeine, dairy, permanent based foods. Eat 6 smaller meals Also recommend eating at least 2 to 3 hours before bedtime. As discussed, if symptoms return, and you develop chest pain, nausea, vomiting, shortness of breath, difficulty breathing, or other concerns, please go to the emergency department immediately for further evaluation. I would like for you to follow-up with your primary care physician to advise the symptoms you experienced today.  Make sure you attend your upcoming scheduled appointment.     ED Prescriptions     Medication Sig Dispense Auth. Provider   alum & mag hydroxide-simeth (MYLANTA MAXIMUM STRENGTH) 400-400-40 MG/5ML suspension Take 15 mLs by mouth every 6 (six) hours as needed for indigestion. 355 mL Maleka Contino-Warren,  Sadie Haber, NP   pantoprazole (PROTONIX) 40 MG tablet Take 1 tablet (40 mg total) by mouth daily. 30 tablet Schwanda Zima-Warren, Sadie Haber, NP   ondansetron (ZOFRAN-ODT) 4 MG disintegrating tablet Take 1 tablet (4 mg total) by mouth every 8 (eight) hours as needed for nausea or vomiting. 20 tablet Tria Noguera-Warren, Sadie Haber, NP      PDMP not reviewed this encounter.   Abran Cantor, NP 08/09/22 1005    Sandee Bernath-Warren, Sadie Haber, NP 08/09/22 1007

## 2022-08-09 NOTE — Discharge Instructions (Addendum)
Take medication as prescribed. Increase fluids and allow for plenty of rest. May take over-the-counter Tylenol as needed for pain, fever, general discomfort. Recommend a bland diet today.  I would avoid foods high in sodium, tomato-based foods, spicy foods, caffeine, dairy, permanent based foods. Eat 6 smaller meals Also recommend eating at least 2 to 3 hours before bedtime. As discussed, if symptoms return, and you develop chest pain, nausea, vomiting, shortness of breath, difficulty breathing, or other concerns, please go to the emergency department immediately for further evaluation. I would like for you to follow-up with your primary care physician to advise the symptoms you experienced today.  Make sure you attend your upcoming scheduled appointment.

## 2022-08-09 NOTE — ED Triage Notes (Signed)
Pt c/o nausea and chest pain, pt woke up at 4:30 chest pain and nausea headache and trembling, dry heaving, indigestion, pain across shoulder blades, weak on her feet, pressure ibn her chest.

## 2022-08-09 NOTE — ED Notes (Signed)
Patient states she feels so much better now.  "I could just go to sleep".

## 2022-08-10 LAB — SARS CORONAVIRUS 2 (TAT 6-24 HRS): SARS Coronavirus 2: NEGATIVE

## 2022-08-17 DIAGNOSIS — D485 Neoplasm of uncertain behavior of skin: Secondary | ICD-10-CM | POA: Diagnosis not present

## 2022-08-17 DIAGNOSIS — L570 Actinic keratosis: Secondary | ICD-10-CM | POA: Diagnosis not present

## 2022-08-17 DIAGNOSIS — L280 Lichen simplex chronicus: Secondary | ICD-10-CM | POA: Diagnosis not present

## 2022-08-21 DIAGNOSIS — J3081 Allergic rhinitis due to animal (cat) (dog) hair and dander: Secondary | ICD-10-CM | POA: Diagnosis not present

## 2022-08-21 DIAGNOSIS — J3089 Other allergic rhinitis: Secondary | ICD-10-CM | POA: Diagnosis not present

## 2022-08-21 DIAGNOSIS — J301 Allergic rhinitis due to pollen: Secondary | ICD-10-CM | POA: Diagnosis not present

## 2022-08-28 DIAGNOSIS — J3089 Other allergic rhinitis: Secondary | ICD-10-CM | POA: Diagnosis not present

## 2022-08-28 DIAGNOSIS — J301 Allergic rhinitis due to pollen: Secondary | ICD-10-CM | POA: Diagnosis not present

## 2022-08-28 DIAGNOSIS — J3081 Allergic rhinitis due to animal (cat) (dog) hair and dander: Secondary | ICD-10-CM | POA: Diagnosis not present

## 2022-08-31 DIAGNOSIS — K589 Irritable bowel syndrome without diarrhea: Secondary | ICD-10-CM | POA: Diagnosis not present

## 2022-08-31 DIAGNOSIS — J45901 Unspecified asthma with (acute) exacerbation: Secondary | ICD-10-CM | POA: Diagnosis not present

## 2022-08-31 DIAGNOSIS — N3281 Overactive bladder: Secondary | ICD-10-CM | POA: Diagnosis not present

## 2022-08-31 DIAGNOSIS — M810 Age-related osteoporosis without current pathological fracture: Secondary | ICD-10-CM | POA: Diagnosis not present

## 2022-08-31 DIAGNOSIS — Z79899 Other long term (current) drug therapy: Secondary | ICD-10-CM | POA: Diagnosis not present

## 2022-08-31 DIAGNOSIS — E785 Hyperlipidemia, unspecified: Secondary | ICD-10-CM | POA: Diagnosis not present

## 2022-09-04 DIAGNOSIS — J3081 Allergic rhinitis due to animal (cat) (dog) hair and dander: Secondary | ICD-10-CM | POA: Diagnosis not present

## 2022-09-04 DIAGNOSIS — J301 Allergic rhinitis due to pollen: Secondary | ICD-10-CM | POA: Diagnosis not present

## 2022-09-04 DIAGNOSIS — J3089 Other allergic rhinitis: Secondary | ICD-10-CM | POA: Diagnosis not present

## 2022-09-09 DIAGNOSIS — J301 Allergic rhinitis due to pollen: Secondary | ICD-10-CM | POA: Diagnosis not present

## 2022-09-09 DIAGNOSIS — J3081 Allergic rhinitis due to animal (cat) (dog) hair and dander: Secondary | ICD-10-CM | POA: Diagnosis not present

## 2022-09-09 DIAGNOSIS — J3089 Other allergic rhinitis: Secondary | ICD-10-CM | POA: Diagnosis not present

## 2022-09-15 DIAGNOSIS — M810 Age-related osteoporosis without current pathological fracture: Secondary | ICD-10-CM | POA: Diagnosis not present

## 2022-09-15 DIAGNOSIS — R1013 Epigastric pain: Secondary | ICD-10-CM | POA: Diagnosis not present

## 2022-09-15 DIAGNOSIS — J45909 Unspecified asthma, uncomplicated: Secondary | ICD-10-CM | POA: Diagnosis not present

## 2022-09-15 DIAGNOSIS — Z0001 Encounter for general adult medical examination with abnormal findings: Secondary | ICD-10-CM | POA: Diagnosis not present

## 2022-09-15 DIAGNOSIS — E785 Hyperlipidemia, unspecified: Secondary | ICD-10-CM | POA: Diagnosis not present

## 2022-09-16 ENCOUNTER — Other Ambulatory Visit (HOSPITAL_COMMUNITY): Payer: Self-pay | Admitting: Internal Medicine

## 2022-09-16 DIAGNOSIS — M810 Age-related osteoporosis without current pathological fracture: Secondary | ICD-10-CM

## 2022-09-18 DIAGNOSIS — J301 Allergic rhinitis due to pollen: Secondary | ICD-10-CM | POA: Diagnosis not present

## 2022-09-18 DIAGNOSIS — J3089 Other allergic rhinitis: Secondary | ICD-10-CM | POA: Diagnosis not present

## 2022-09-18 DIAGNOSIS — J3081 Allergic rhinitis due to animal (cat) (dog) hair and dander: Secondary | ICD-10-CM | POA: Diagnosis not present

## 2022-09-21 ENCOUNTER — Other Ambulatory Visit (HOSPITAL_COMMUNITY): Payer: Self-pay | Admitting: Internal Medicine

## 2022-09-21 DIAGNOSIS — R109 Unspecified abdominal pain: Secondary | ICD-10-CM

## 2022-09-23 ENCOUNTER — Ambulatory Visit (HOSPITAL_COMMUNITY)
Admission: RE | Admit: 2022-09-23 | Discharge: 2022-09-23 | Disposition: A | Discharge status: Home / Self Care | Payer: Medicare PPO | Source: Ambulatory Visit | Attending: Internal Medicine | Admitting: Internal Medicine

## 2022-09-23 DIAGNOSIS — M810 Age-related osteoporosis without current pathological fracture: Secondary | ICD-10-CM | POA: Insufficient documentation

## 2022-09-23 DIAGNOSIS — Z780 Asymptomatic menopausal state: Secondary | ICD-10-CM | POA: Diagnosis not present

## 2022-09-23 DIAGNOSIS — M8589 Other specified disorders of bone density and structure, multiple sites: Secondary | ICD-10-CM | POA: Diagnosis not present

## 2022-09-25 DIAGNOSIS — J3089 Other allergic rhinitis: Secondary | ICD-10-CM | POA: Diagnosis not present

## 2022-09-25 DIAGNOSIS — J301 Allergic rhinitis due to pollen: Secondary | ICD-10-CM | POA: Diagnosis not present

## 2022-09-25 DIAGNOSIS — J3081 Allergic rhinitis due to animal (cat) (dog) hair and dander: Secondary | ICD-10-CM | POA: Diagnosis not present

## 2022-09-27 ENCOUNTER — Other Ambulatory Visit (HOSPITAL_BASED_OUTPATIENT_CLINIC_OR_DEPARTMENT_OTHER): Payer: Medicare PPO

## 2022-10-04 ENCOUNTER — Ambulatory Visit (HOSPITAL_BASED_OUTPATIENT_CLINIC_OR_DEPARTMENT_OTHER)
Admission: RE | Admit: 2022-10-04 | Discharge: 2022-10-04 | Disposition: A | Discharge status: Home / Self Care | Payer: Medicare PPO | Source: Ambulatory Visit | Attending: Internal Medicine | Admitting: Internal Medicine

## 2022-10-04 DIAGNOSIS — K449 Diaphragmatic hernia without obstruction or gangrene: Secondary | ICD-10-CM | POA: Diagnosis not present

## 2022-10-04 DIAGNOSIS — R109 Unspecified abdominal pain: Secondary | ICD-10-CM | POA: Diagnosis not present

## 2022-10-04 DIAGNOSIS — R1013 Epigastric pain: Secondary | ICD-10-CM | POA: Diagnosis not present

## 2022-10-04 MED ORDER — IOHEXOL 300 MG/ML  SOLN
100.0000 mL | Freq: Once | INTRAMUSCULAR | Status: AC | PRN
  Administered 2022-10-04: 70 mL via INTRAVENOUS

## 2022-10-23 DIAGNOSIS — J301 Allergic rhinitis due to pollen: Secondary | ICD-10-CM | POA: Diagnosis not present

## 2022-10-23 DIAGNOSIS — J3089 Other allergic rhinitis: Secondary | ICD-10-CM | POA: Diagnosis not present

## 2022-10-23 DIAGNOSIS — J3081 Allergic rhinitis due to animal (cat) (dog) hair and dander: Secondary | ICD-10-CM | POA: Diagnosis not present

## 2022-11-06 DIAGNOSIS — J301 Allergic rhinitis due to pollen: Secondary | ICD-10-CM | POA: Diagnosis not present

## 2022-11-06 DIAGNOSIS — J3089 Other allergic rhinitis: Secondary | ICD-10-CM | POA: Diagnosis not present

## 2022-11-06 DIAGNOSIS — J3081 Allergic rhinitis due to animal (cat) (dog) hair and dander: Secondary | ICD-10-CM | POA: Diagnosis not present

## 2022-11-18 DIAGNOSIS — J3089 Other allergic rhinitis: Secondary | ICD-10-CM | POA: Diagnosis not present

## 2022-11-18 DIAGNOSIS — J3081 Allergic rhinitis due to animal (cat) (dog) hair and dander: Secondary | ICD-10-CM | POA: Diagnosis not present

## 2022-11-18 DIAGNOSIS — J301 Allergic rhinitis due to pollen: Secondary | ICD-10-CM | POA: Diagnosis not present

## 2022-11-23 DIAGNOSIS — H18603 Keratoconus, unspecified, bilateral: Secondary | ICD-10-CM | POA: Diagnosis not present

## 2022-11-23 DIAGNOSIS — H02403 Unspecified ptosis of bilateral eyelids: Secondary | ICD-10-CM | POA: Diagnosis not present

## 2022-11-23 DIAGNOSIS — H2513 Age-related nuclear cataract, bilateral: Secondary | ICD-10-CM | POA: Diagnosis not present

## 2022-11-30 DIAGNOSIS — Z79899 Other long term (current) drug therapy: Secondary | ICD-10-CM | POA: Diagnosis not present

## 2022-11-30 DIAGNOSIS — E785 Hyperlipidemia, unspecified: Secondary | ICD-10-CM | POA: Diagnosis not present

## 2022-12-04 DIAGNOSIS — J301 Allergic rhinitis due to pollen: Secondary | ICD-10-CM | POA: Diagnosis not present

## 2022-12-04 DIAGNOSIS — J3081 Allergic rhinitis due to animal (cat) (dog) hair and dander: Secondary | ICD-10-CM | POA: Diagnosis not present

## 2022-12-04 DIAGNOSIS — J3089 Other allergic rhinitis: Secondary | ICD-10-CM | POA: Diagnosis not present

## 2022-12-07 DIAGNOSIS — I700 Atherosclerosis of aorta: Secondary | ICD-10-CM | POA: Diagnosis not present

## 2022-12-07 DIAGNOSIS — K219 Gastro-esophageal reflux disease without esophagitis: Secondary | ICD-10-CM | POA: Diagnosis not present

## 2022-12-11 DIAGNOSIS — J3081 Allergic rhinitis due to animal (cat) (dog) hair and dander: Secondary | ICD-10-CM | POA: Diagnosis not present

## 2022-12-11 DIAGNOSIS — J3089 Other allergic rhinitis: Secondary | ICD-10-CM | POA: Diagnosis not present

## 2022-12-11 DIAGNOSIS — J301 Allergic rhinitis due to pollen: Secondary | ICD-10-CM | POA: Diagnosis not present

## 2022-12-18 ENCOUNTER — Other Ambulatory Visit: Payer: Self-pay | Admitting: Medical Genetics

## 2022-12-18 DIAGNOSIS — Z006 Encounter for examination for normal comparison and control in clinical research program: Secondary | ICD-10-CM

## 2022-12-22 ENCOUNTER — Other Ambulatory Visit (HOSPITAL_COMMUNITY)
Admission: RE | Admit: 2022-12-22 | Discharge: 2022-12-22 | Disposition: A | Discharge status: Home / Self Care | Payer: Medicare PPO | Source: Ambulatory Visit | Attending: Oncology | Admitting: Oncology

## 2022-12-22 DIAGNOSIS — Z006 Encounter for examination for normal comparison and control in clinical research program: Secondary | ICD-10-CM | POA: Insufficient documentation

## 2022-12-25 DIAGNOSIS — J3081 Allergic rhinitis due to animal (cat) (dog) hair and dander: Secondary | ICD-10-CM | POA: Diagnosis not present

## 2022-12-25 DIAGNOSIS — J3089 Other allergic rhinitis: Secondary | ICD-10-CM | POA: Diagnosis not present

## 2022-12-25 DIAGNOSIS — J301 Allergic rhinitis due to pollen: Secondary | ICD-10-CM | POA: Diagnosis not present

## 2022-12-29 LAB — GENECONNECT MOLECULAR SCREEN: Genetic Analysis Overall Interpretation: NEGATIVE

## 2022-12-29 IMAGING — CT CT CHEST W/O CM
2 of 4 series · 15 of 36 positions shown, 18 images · non-contrast
Comparison: X-ray 05/26/2021

CLINICAL DATA: Cough, chest tightness.  Follow-up pneumonia



[Series 2: routine chest without · axial · non-contrast · 0.70mm/px · z∈[+1253,+1509]mm · 12 of 152 slices shown, 15 images]
[im 12/152  mediastinal]
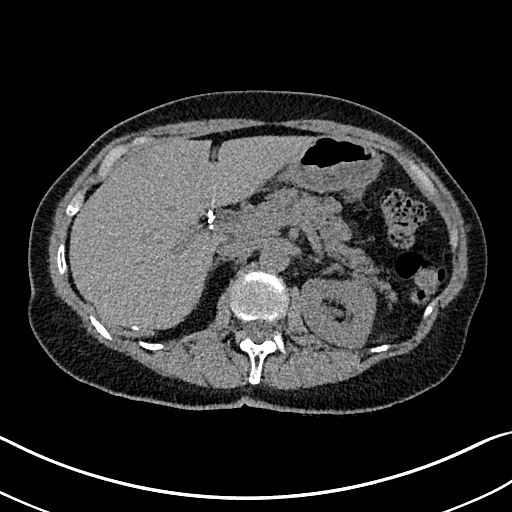
[im 12/152  lung]
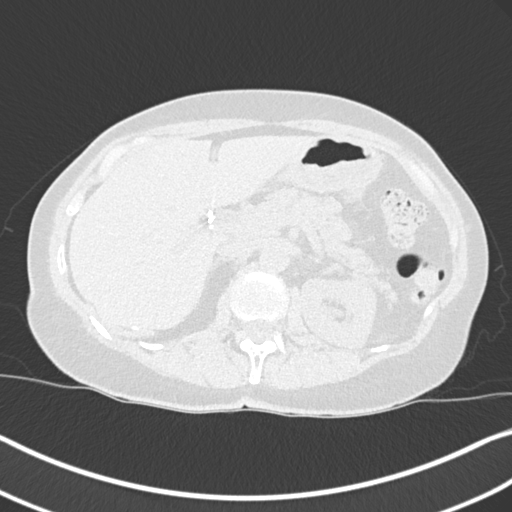
[im 24/152  lung]
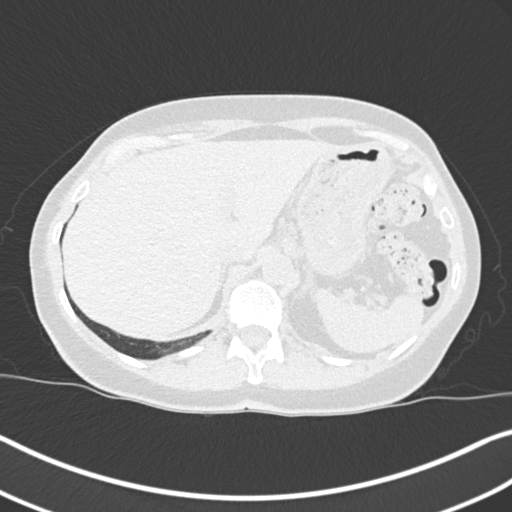
[im 35/152  lung]
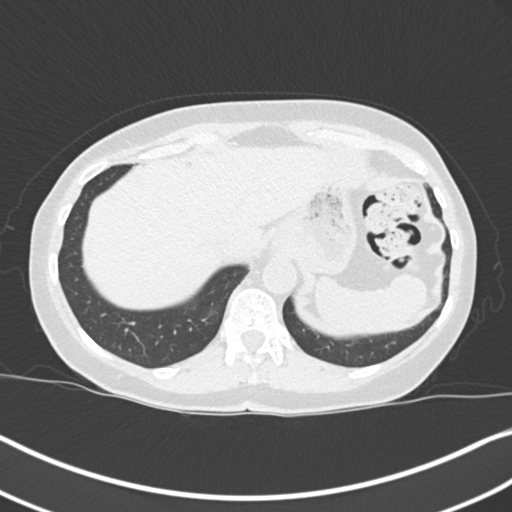
[im 47/152  lung]
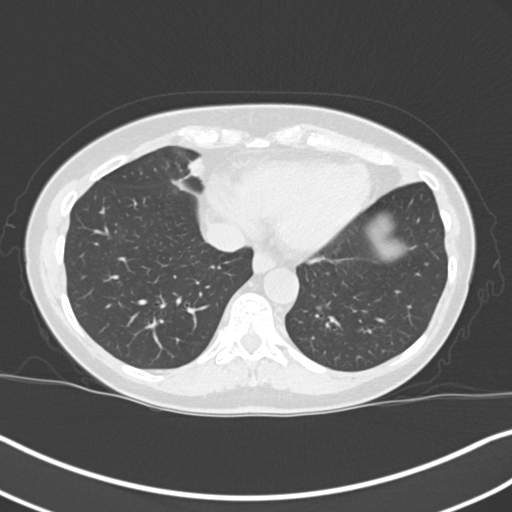
[im 59/152  mediastinal]
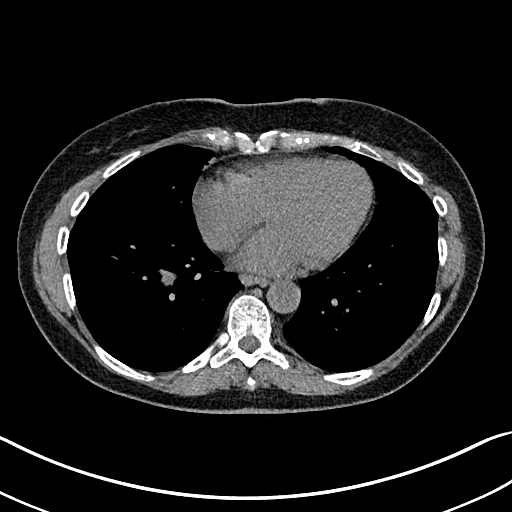
[im 59/152  lung]
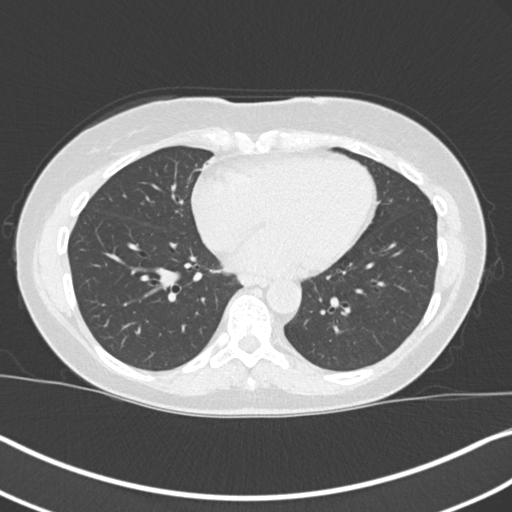
[im 70/152  lung]
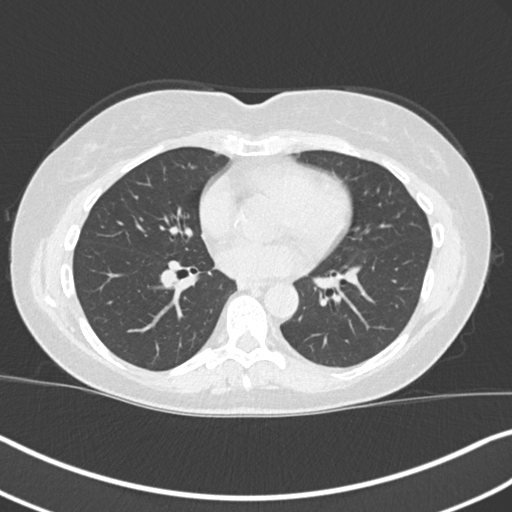
[im 82/152  lung]
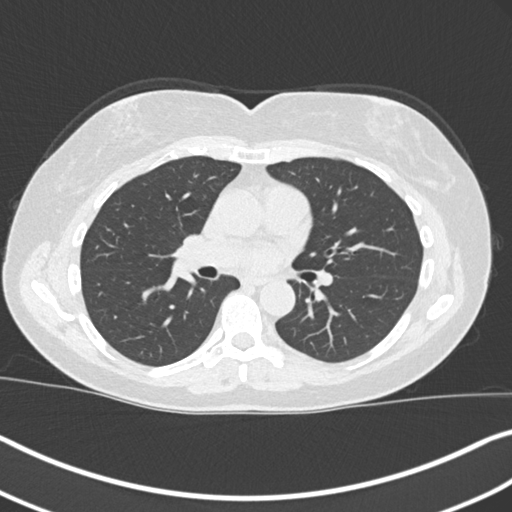
[im 93/152  lung]
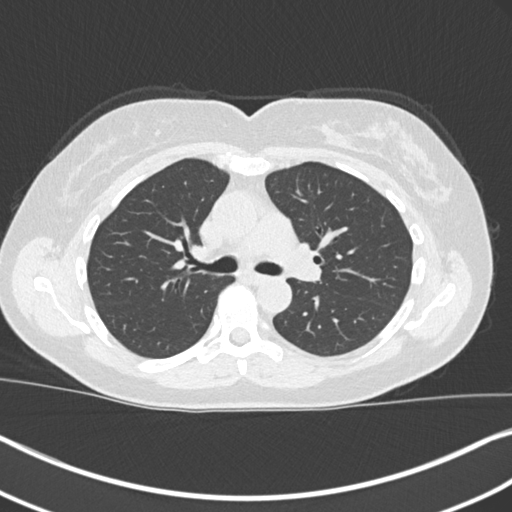
[im 105/152  mediastinal]
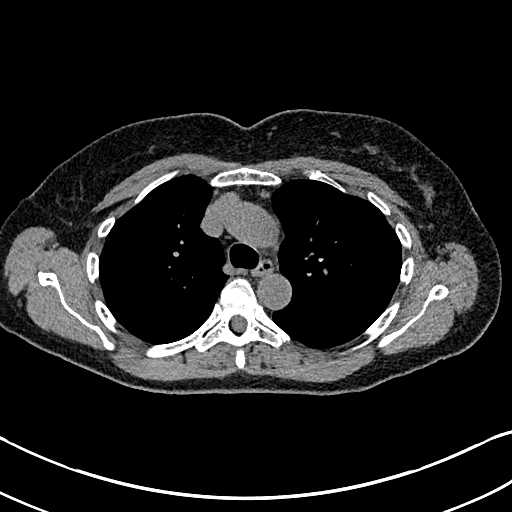
[im 105/152  lung]
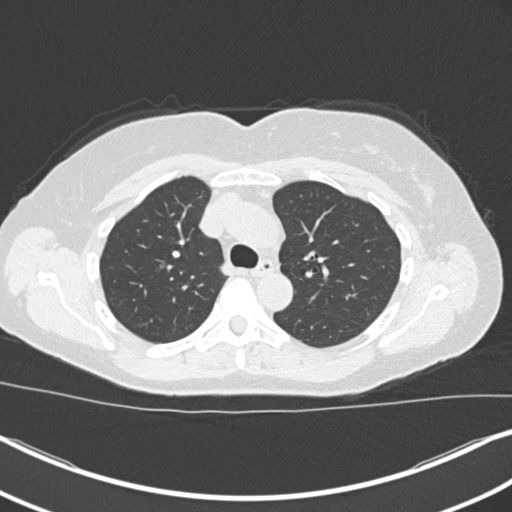
[im 117/152  lung]
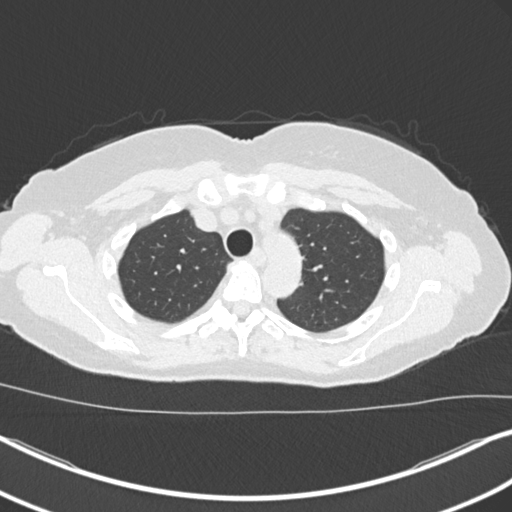
[im 128/152  lung]
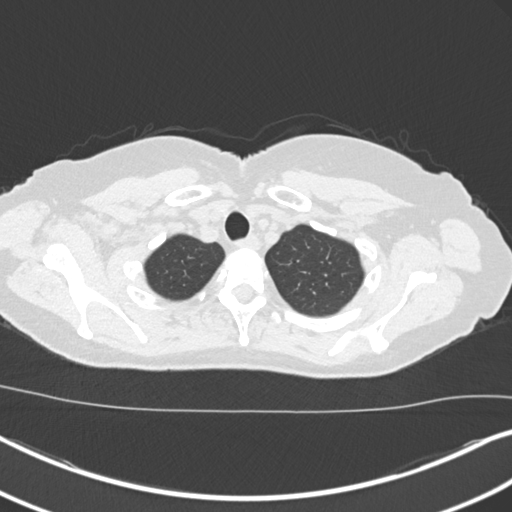
[im 140/152  lung]
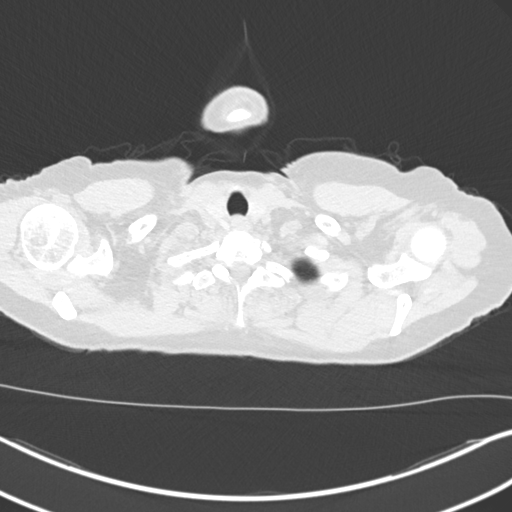

[Series 5: coronal · coronal · 0.65mm/px · 3 of 119 slices shown]
[im 24/119  lung]
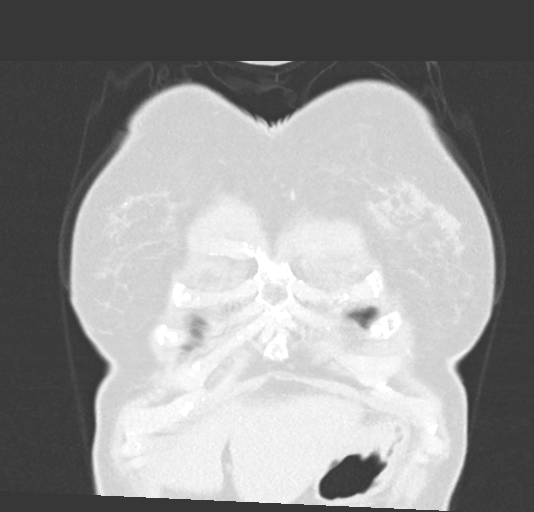
[im 48/119  lung]
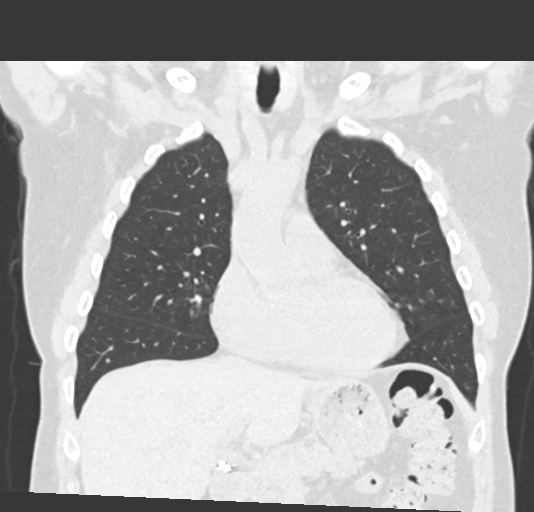
[im 71/119  lung]
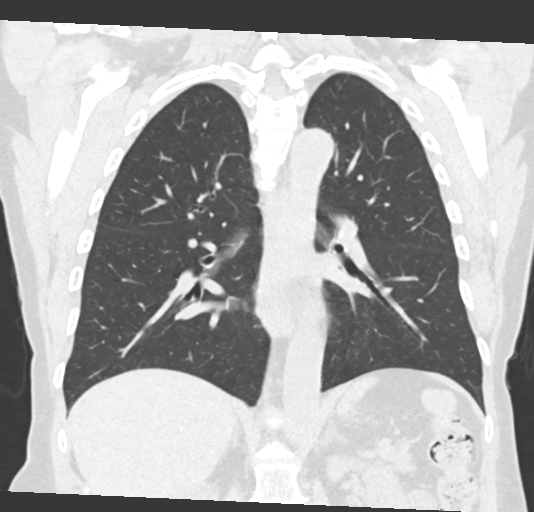

[15 of 36 positions shown; findings below may reference images not displayed]

FINDINGS: Cardiovascular: No significant vascular findings. Thoracic aorta is
normal in course and caliber. Minimal atherosclerotic calcification
of the aorta and coronary arteries. Central pulmonary vasculature is
nondilated. Normal heart size. No pericardial effusion.

Mediastinum/Nodes: No enlarged mediastinal or axillary lymph nodes.
Thyroid gland, trachea, and esophagus demonstrate no significant
findings. Small hiatal hernia.

Lungs/Pleura: Small area of scarring or atelectasis within the
medial aspect of the right middle lobe. Lungs are otherwise clear.
No focal airspace consolidation. No pleural effusion or
pneumothorax.

Upper Abdomen: No acute abnormality.

Musculoskeletal: No chest wall mass or suspicious bone lesions
identified.
IMPRESSION: 1. No acute cardiopulmonary findings.
2. Small area of scarring or atelectasis within the right middle
lobe. Lungs are otherwise clear.
3. Small hiatal hernia.

## 2023-01-14 DIAGNOSIS — J301 Allergic rhinitis due to pollen: Secondary | ICD-10-CM | POA: Diagnosis not present

## 2023-01-14 DIAGNOSIS — J3089 Other allergic rhinitis: Secondary | ICD-10-CM | POA: Diagnosis not present

## 2023-01-14 DIAGNOSIS — J3081 Allergic rhinitis due to animal (cat) (dog) hair and dander: Secondary | ICD-10-CM | POA: Diagnosis not present

## 2023-01-19 DIAGNOSIS — R051 Acute cough: Secondary | ICD-10-CM | POA: Diagnosis not present

## 2023-01-19 DIAGNOSIS — K219 Gastro-esophageal reflux disease without esophagitis: Secondary | ICD-10-CM | POA: Diagnosis not present

## 2023-02-19 DIAGNOSIS — J301 Allergic rhinitis due to pollen: Secondary | ICD-10-CM | POA: Diagnosis not present

## 2023-02-19 DIAGNOSIS — J3081 Allergic rhinitis due to animal (cat) (dog) hair and dander: Secondary | ICD-10-CM | POA: Diagnosis not present

## 2023-02-19 DIAGNOSIS — J3089 Other allergic rhinitis: Secondary | ICD-10-CM | POA: Diagnosis not present

## 2023-02-22 DIAGNOSIS — J4530 Mild persistent asthma, uncomplicated: Secondary | ICD-10-CM | POA: Diagnosis not present

## 2023-02-22 DIAGNOSIS — J3089 Other allergic rhinitis: Secondary | ICD-10-CM | POA: Diagnosis not present

## 2023-02-22 DIAGNOSIS — J301 Allergic rhinitis due to pollen: Secondary | ICD-10-CM | POA: Diagnosis not present

## 2023-02-22 DIAGNOSIS — J3081 Allergic rhinitis due to animal (cat) (dog) hair and dander: Secondary | ICD-10-CM | POA: Diagnosis not present

## 2023-03-02 DIAGNOSIS — J301 Allergic rhinitis due to pollen: Secondary | ICD-10-CM | POA: Diagnosis not present

## 2023-03-02 DIAGNOSIS — J3081 Allergic rhinitis due to animal (cat) (dog) hair and dander: Secondary | ICD-10-CM | POA: Diagnosis not present

## 2023-03-02 DIAGNOSIS — J3089 Other allergic rhinitis: Secondary | ICD-10-CM | POA: Diagnosis not present

## 2023-03-26 DIAGNOSIS — J301 Allergic rhinitis due to pollen: Secondary | ICD-10-CM | POA: Diagnosis not present

## 2023-03-26 DIAGNOSIS — J3089 Other allergic rhinitis: Secondary | ICD-10-CM | POA: Diagnosis not present

## 2023-03-26 DIAGNOSIS — J3081 Allergic rhinitis due to animal (cat) (dog) hair and dander: Secondary | ICD-10-CM | POA: Diagnosis not present

## 2023-04-20 DIAGNOSIS — J3081 Allergic rhinitis due to animal (cat) (dog) hair and dander: Secondary | ICD-10-CM | POA: Diagnosis not present

## 2023-04-20 DIAGNOSIS — J301 Allergic rhinitis due to pollen: Secondary | ICD-10-CM | POA: Diagnosis not present

## 2023-04-20 DIAGNOSIS — J3089 Other allergic rhinitis: Secondary | ICD-10-CM | POA: Diagnosis not present

## 2023-04-28 DIAGNOSIS — K219 Gastro-esophageal reflux disease without esophagitis: Secondary | ICD-10-CM | POA: Diagnosis not present

## 2023-04-28 DIAGNOSIS — I700 Atherosclerosis of aorta: Secondary | ICD-10-CM | POA: Diagnosis not present

## 2023-05-17 ENCOUNTER — Other Ambulatory Visit (HOSPITAL_COMMUNITY): Payer: Self-pay | Admitting: Internal Medicine

## 2023-05-17 DIAGNOSIS — Z1231 Encounter for screening mammogram for malignant neoplasm of breast: Secondary | ICD-10-CM

## 2023-05-24 ENCOUNTER — Ambulatory Visit (HOSPITAL_COMMUNITY)
Admission: RE | Admit: 2023-05-24 | Discharge: 2023-05-24 | Disposition: A | Discharge status: Home / Self Care | Payer: Medicare PPO | Source: Ambulatory Visit | Attending: Allergy | Admitting: Allergy

## 2023-05-24 DIAGNOSIS — Z1231 Encounter for screening mammogram for malignant neoplasm of breast: Secondary | ICD-10-CM | POA: Insufficient documentation

## 2023-05-26 DIAGNOSIS — J301 Allergic rhinitis due to pollen: Secondary | ICD-10-CM | POA: Diagnosis not present

## 2023-05-26 DIAGNOSIS — J3089 Other allergic rhinitis: Secondary | ICD-10-CM | POA: Diagnosis not present

## 2023-05-26 DIAGNOSIS — J3081 Allergic rhinitis due to animal (cat) (dog) hair and dander: Secondary | ICD-10-CM | POA: Diagnosis not present

## 2023-05-27 DIAGNOSIS — D122 Benign neoplasm of ascending colon: Secondary | ICD-10-CM | POA: Diagnosis not present

## 2023-05-27 DIAGNOSIS — Z09 Encounter for follow-up examination after completed treatment for conditions other than malignant neoplasm: Secondary | ICD-10-CM | POA: Diagnosis not present

## 2023-05-27 DIAGNOSIS — Z860101 Personal history of adenomatous and serrated colon polyps: Secondary | ICD-10-CM | POA: Diagnosis not present

## 2023-07-07 DIAGNOSIS — Z01419 Encounter for gynecological examination (general) (routine) without abnormal findings: Secondary | ICD-10-CM | POA: Diagnosis not present

## 2023-07-07 DIAGNOSIS — M810 Age-related osteoporosis without current pathological fracture: Secondary | ICD-10-CM | POA: Diagnosis not present

## 2023-07-07 DIAGNOSIS — Z1239 Encounter for other screening for malignant neoplasm of breast: Secondary | ICD-10-CM | POA: Diagnosis not present

## 2023-07-07 DIAGNOSIS — N952 Postmenopausal atrophic vaginitis: Secondary | ICD-10-CM | POA: Diagnosis not present

## 2023-07-07 DIAGNOSIS — Z1211 Encounter for screening for malignant neoplasm of colon: Secondary | ICD-10-CM | POA: Diagnosis not present

## 2023-07-07 DIAGNOSIS — Z124 Encounter for screening for malignant neoplasm of cervix: Secondary | ICD-10-CM | POA: Diagnosis not present

## 2023-07-07 DIAGNOSIS — Z1339 Encounter for screening examination for other mental health and behavioral disorders: Secondary | ICD-10-CM | POA: Diagnosis not present

## 2023-07-22 ENCOUNTER — Ambulatory Visit (INDEPENDENT_AMBULATORY_CARE_PROVIDER_SITE_OTHER)

## 2023-07-22 ENCOUNTER — Ambulatory Visit: Admitting: Podiatry

## 2023-07-22 DIAGNOSIS — M6740 Ganglion, unspecified site: Secondary | ICD-10-CM

## 2023-07-22 DIAGNOSIS — M2041 Other hammer toe(s) (acquired), right foot: Secondary | ICD-10-CM

## 2023-07-22 DIAGNOSIS — M7751 Other enthesopathy of right foot: Secondary | ICD-10-CM | POA: Diagnosis not present

## 2023-07-22 DIAGNOSIS — M21611 Bunion of right foot: Secondary | ICD-10-CM | POA: Diagnosis not present

## 2023-07-22 MED ORDER — TRIAMCINOLONE ACETONIDE 10 MG/ML IJ SUSP
10.0000 mg | Freq: Once | INTRAMUSCULAR | Status: AC
  Administered 2023-07-22: 10 mg via INTRA_ARTICULAR

## 2023-07-22 NOTE — Progress Notes (Signed)
 Subjective:   Patient ID: Yesenia Welch, female   DOB: 73 y.o.   MRN: [id number]   HPI Patient presents stating that she has a lot of pain in the dorsum of her right foot third toe and also has a structural bunion deformity hammertoe deformity which has become painful.  States that it is inflamed and that she does not remember specific injury but noted a lesion there.  Patient does not smoke likes to be active   Review of Systems  All other systems reviewed and are negative.       Objective:  Physical Exam Vitals and nursing note reviewed.  Constitutional:      Appearance: She is well-developed.  Pulmonary:     Effort: Pulmonary effort is normal.  Musculoskeletal:        General: Normal range of motion.  Skin:    General: Skin is warm.  Neurological:     Mental Status: She is alert.     Neurovascular status found to be intact muscle strength found to be adequate range of motion was adequate with hyperostosis medial aspect first metatarsal head right with elevated second digit with moderate rigid contracture and distal deformity third digit right with swelling of the joint and lesion formation painful with pressure.  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  At this point I did a proximal nerve block of the right third digit sterile prep I carefully injected the interphalangeal joint 2 mg Dexasone Kenalog 5 mg Xylocaine did courtesy debridement small amount of gelatinous fluid and it may ultimately require resection with distal arthroplasty.  Did discuss digital fusion digit 2 right bunion correction right which may be done depending on response to conservative treatment.  Patient to be seen back as symptoms indicate  X-rays indicate structural bunion deformity  right deviation of the right hallux against the second toe elevated second toe noted

## 2023-07-29 DIAGNOSIS — H9202 Otalgia, left ear: Secondary | ICD-10-CM | POA: Diagnosis not present

## 2023-08-23 DIAGNOSIS — L814 Other melanin hyperpigmentation: Secondary | ICD-10-CM | POA: Diagnosis not present

## 2023-08-23 DIAGNOSIS — L280 Lichen simplex chronicus: Secondary | ICD-10-CM | POA: Diagnosis not present

## 2023-08-23 DIAGNOSIS — I781 Nevus, non-neoplastic: Secondary | ICD-10-CM | POA: Diagnosis not present

## 2023-08-23 DIAGNOSIS — L570 Actinic keratosis: Secondary | ICD-10-CM | POA: Diagnosis not present

## 2023-09-13 ENCOUNTER — Ambulatory Visit
Admission: EM | Admit: 2023-09-13 | Discharge: 2023-09-13 | Disposition: A | Discharge status: Home / Self Care | Attending: Nurse Practitioner | Admitting: Nurse Practitioner

## 2023-09-13 DIAGNOSIS — J069 Acute upper respiratory infection, unspecified: Secondary | ICD-10-CM | POA: Diagnosis not present

## 2023-09-13 DIAGNOSIS — Z8709 Personal history of other diseases of the respiratory system: Secondary | ICD-10-CM

## 2023-09-13 MED ORDER — GUAIFENESIN 100 MG/5ML PO LIQD: 10.0000 mL | Freq: Four times a day (QID) | ORAL | 0 refills | Status: AC | PRN

## 2023-09-13 MED ORDER — PREDNISONE 20 MG PO TABS: 40.0000 mg | ORAL_TABLET | Freq: Every day | ORAL | 0 refills | Status: AC

## 2023-09-13 NOTE — Discharge Instructions (Signed)
 Take medication as prescribed. Increase fluids and allow for plenty of rest. You may take over-the-counter Tylenol  as needed for pain, fever, or general discomfort. Recommend warm salt water gargles 3-4 times daily as needed for throat pain or discomfort. Recommend use of a humidifier in your bedroom at nighttime during sleep and sleeping elevated on pillows while symptoms persist. Symptoms should improve over the next 5 to 7 days.  If symptoms fail to improve, or appear to be worsening, you may follow-up in this clinic or with your primary care physician for further evaluation. Follow-up as needed.

## 2023-09-13 NOTE — ED Triage Notes (Addendum)
 Pt report sinus drainage, sore throat, cough, x5 days and now having chest discomfort. Home COVID test negative.

## 2023-09-13 NOTE — ED Provider Notes (Signed)
 [facility]    CSN: [id number] Arrival date & time: 09/13/23  8295      History   Chief Complaint No chief complaint on file.   HPI Yesenia Welch is a 73 y.o. female.   The history is provided by the patient.   Patient with a several day history of cough, chest congestion, postnasal drainage, and throat irritation.  Patient also complains of chest discomfort on the right side.  Patient denies fever, chills, headache, ear pain, wheezing, difficulty breathing, abdominal pain, nausea, vomiting, diarrhea, or rash.  Patient states that she took NyQuil on 1 occasion for her symptoms but has not taken any other medications.  She reports underlying history of asthma.  Past Medical History:  Diagnosis Date   Asthma    Keratoconus of both eyes    Osteopenia    PONV (postoperative nausea and vomiting)     Patient Active Problem List   Diagnosis Date Noted   Right ovarian cyst 03/22/2018   Overweight 06/05/2009   CHEST DISCOMFORT 06/05/2009   ABNORMAL ELECTROCARDIOGRAM 06/05/2009    Past Surgical History:  Procedure Laterality Date   BREAST BIOPSY Left 2003   Benign   BREAST LUMPECTOMY  2003   LEFT BREAST   CESAREAN SECTION     October 1975, March 1977   CHOLECYSTECTOMY  1996   COLONOSCOPY  02/12/2012   DILATION AND CURETTAGE OF UTERUS  1973   ENDOMETRIAL ABLATION  2006   UTERINE   ROBOTIC ASSISTED BILATERAL SALPINGO OOPHERECTOMY Bilateral 03/22/2018   Procedure: XI ROBOTIC ASSISTED BILATERAL SALPINGO OOPHORECTOMY WITH PERITONEAL WASHINGS;  Surgeon: Alphonso Aschoff, MD;  Location: [facility];  Service: Gynecology;  Laterality: Bilateral;   SALPINGOOPHORECTOMY     bil Dr. Pearly Bound   03-22-18   TONSILLECTOMY  1956    OB History   No obstetric history on file.      Home Medications    Prior to Admission medications   Medication Sig Start Date End Date Taking? Authorizing Provider  guaiFENesin (ROBITUSSIN) 100 MG/5ML liquid Take 10 mLs by mouth every 6 (six) hours  as needed for cough or to loosen phlegm. 09/13/23  Yes Leath-Warren, Belen Bowers, NP  predniSONE  (DELTASONE ) 20 MG tablet Take 2 tablets (40 mg total) by mouth daily with breakfast for 5 days. 09/13/23 09/18/23 Yes Leath-Warren, Belen Bowers, NP  albuterol (PROVENTIL HFA;VENTOLIN HFA) 108 (90 Base) MCG/ACT inhaler Inhale 1-2 puffs into the lungs as needed for wheezing or shortness of breath.    [provider]  alendronate (FOSAMAX) 70 MG tablet  01/23/20   [provider]  alum & mag hydroxide-simeth (MYLANTA MAXIMUM STRENGTH) 400-400-40 MG/5ML suspension Take 15 mLs by mouth every 6 (six) hours as needed for indigestion. 08/09/22   Leath-Warren, Belen Bowers, NP  benzonatate  (TESSALON ) 100 MG capsule Take 1-2 capsules (100-200 mg total) by mouth 3 (three) times daily as needed for cough. 05/18/21   Adolph Hoop, PA-C  cetirizine (ZYRTEC) 10 MG tablet Take 10 mg by mouth daily.    [provider]  Estradiol 10 MCG TABS vaginal tablet Imvexxy Maintenance Pack 10 mcg vaginal insert  Insert 1 vaginal insert twice a week by vaginal route.    [provider]  fluticasone (FLONASE) 50 MCG/ACT nasal spray Place 2 sprays into both nostrils daily.    [provider]  fluticasone furoate-vilanterol (BREO ELLIPTA) 200-25 MCG/ACT AEPB 1 puff 05/16/21   [provider]  fluticasone furoate-vilanterol (BREO ELLIPTA) 200-25 MCG/INH AEPB Inhale 1  puff into the lungs daily.     [provider]  ondansetron  (ZOFRAN -ODT) 4 MG disintegrating tablet Take 1 tablet (4 mg total) by mouth every 8 (eight) hours as needed for nausea or vomiting. 08/09/22   Leath-Warren, Belen Bowers, NP  pantoprazole  (PROTONIX ) 40 MG tablet Take 1 tablet (40 mg total) by mouth daily. 08/09/22   Leath-Warren, Belen Bowers, NP  promethazine -dextromethorphan (PROMETHAZINE -DM) 6.25-15 MG/5ML syrup Take 5 mLs by mouth at bedtime as needed for cough. 05/18/21   Adolph Hoop, PA-C  triamcinolone  ointment (KENALOG )  0.1 % triamcinolone  acetonide 0.1 % topical ointment  APPLY A THIN LAYER TO THE AFFECTED AREA(S) BY TOPICAL ROUTE 2 TIMES PER DAY x 7 days    [provider]    Family History Family History  Problem Relation Age of Onset   Heart attack Father        CABG @ AGE 35   Ovarian cancer Paternal Aunt     Social History Social History   Tobacco Use   Smoking status: Never   Smokeless tobacco: Never  Vaping Use   Vaping status: Never Used  Substance Use Topics   Alcohol use: Yes    Alcohol/week: 1.0 standard drink of alcohol    Types: 1 Glasses of wine per week    Comment: daily   Drug use: No     Allergies   Dust mite extract, Gluten meal, and Penicillins   Review of Systems Review of Systems Per HPI  Physical Exam Triage Vital Signs ED Triage Vitals  Encounter Vitals Group     BP 09/13/23 1031 (!) 158/82     Systolic BP Percentile --      Diastolic BP Percentile --      Pulse Rate 09/13/23 1031 62     Resp 09/13/23 1031 16     Temp 09/13/23 1031 98.1 F (36.7 C)     Temp Source 09/13/23 1031 Oral     SpO2 09/13/23 1031 98 %     Weight --      Height --      Head Circumference --      Peak Flow --      Pain Score 09/13/23 1033 0     Pain Loc --      Pain Education --      Exclude from Growth Chart --    No data found.  Updated Vital Signs BP (!) 158/82 (BP Location: Right Arm)   Pulse 62   Temp 98.1 F (36.7 C) (Oral)   Resp 16   SpO2 98%   Visual Acuity Right Eye Distance:   Left Eye Distance:   Bilateral Distance:    Right Eye Near:   Left Eye Near:    Bilateral Near:     Physical Exam Vitals and nursing note reviewed.  Constitutional:      General: She is not in acute distress.    Appearance: Normal appearance.  HENT:     Head: Normocephalic.     Right Ear: Tympanic membrane, ear canal and external ear normal.     Left Ear: Tympanic membrane, ear canal and external ear normal.     Nose: Nose normal.     Mouth/Throat:      Lips: Pink.     Mouth: Mucous membranes are moist.     Pharynx: Uvula midline. Posterior oropharyngeal erythema and postnasal drip present. No pharyngeal swelling, oropharyngeal exudate or uvula swelling.     Comments: Cobblestoning present to posterior  oropharynx  Eyes:     Extraocular Movements: Extraocular movements intact.     Conjunctiva/sclera: Conjunctivae normal.     Pupils: Pupils are equal, round, and reactive to light.  Cardiovascular:     Rate and Rhythm: Normal rate and regular rhythm.     Pulses: Normal pulses.     Heart sounds: Normal heart sounds.  Pulmonary:     Effort: Pulmonary effort is normal. No respiratory distress.     Breath sounds: Normal breath sounds. No stridor. No wheezing, rhonchi or rales.  Abdominal:     General: Bowel sounds are normal.     Palpations: Abdomen is soft.     Tenderness: There is no abdominal tenderness.  Musculoskeletal:     Cervical back: Normal range of motion.  Skin:    General: Skin is warm and dry.  Neurological:     General: No focal deficit present.     Mental Status: She is alert and oriented to person, place, and time.  Psychiatric:        Mood and Affect: Mood normal.        Behavior: Behavior normal.      UC Treatments / Results  Labs (all labs ordered are listed, but only abnormal results are displayed) Labs Reviewed - No data to display  EKG   Radiology No results found.  Procedures Procedures (including critical care time)  Medications Ordered in UC Medications - No data to display  Initial Impression / Assessment and Plan / UC Course  I have reviewed the triage vital signs and the nursing notes.  Pertinent labs & imaging results that were available during my care of the patient were reviewed by me and considered in my medical decision making (see chart for details).  On exam, lung sounds are clear throughout, room air sats at 98%.  The patient is well-appearing, and is in no acute distress.   Symptoms are consistent with a viral URI with cough.  She did have a negative home COVID test.  Will provide symptomatic treatment with guaifenesin 100 mg, and prednisone  40 mg for bronchial inflammation and asthma exacerbation.  Supportive care recommendations were provided and discussed with the patient to include fluids, rest, over-the-counter analgesics, and use of a humidifier during sleep.  Discussed indications with patient regarding follow-up.  Patient was in agreement with this plan of care and verbalizes understanding.  All questions were answered.  Patient stable for discharge.  Final Clinical Impressions(s) / UC Diagnoses   Final diagnoses:  Viral URI with cough  History of asthma     Discharge Instructions      Take medication as prescribed. Increase fluids and allow for plenty of rest. You may take over-the-counter Tylenol  as needed for pain, fever, or general discomfort. Recommend warm salt water gargles 3-4 times daily as needed for throat pain or discomfort. Recommend use of a humidifier in your bedroom at nighttime during sleep and sleeping elevated on pillows while symptoms persist. Symptoms should improve over the next 5 to 7 days.  If symptoms fail to improve, or appear to be worsening, you may follow-up in this clinic or with your primary care physician for further evaluation. Follow-up as needed.   ED Prescriptions     Medication Sig Dispense Auth. Provider   guaiFENesin (ROBITUSSIN) 100 MG/5ML liquid Take 10 mLs by mouth every 6 (six) hours as needed for cough or to loosen phlegm. 300 mL Leath-Warren, Belen Bowers, NP   predniSONE  (DELTASONE ) 20 MG tablet Take  2 tablets (40 mg total) by mouth daily with breakfast for 5 days. 10 tablet Leath-Warren, Belen Bowers, NP      PDMP not reviewed this encounter.   Hardy Lia, NP 09/13/23 1048

## 2023-09-14 DIAGNOSIS — N3281 Overactive bladder: Secondary | ICD-10-CM | POA: Diagnosis not present

## 2023-09-14 DIAGNOSIS — M810 Age-related osteoporosis without current pathological fracture: Secondary | ICD-10-CM | POA: Diagnosis not present

## 2023-09-14 DIAGNOSIS — K219 Gastro-esophageal reflux disease without esophagitis: Secondary | ICD-10-CM | POA: Diagnosis not present

## 2023-09-14 DIAGNOSIS — J45909 Unspecified asthma, uncomplicated: Secondary | ICD-10-CM | POA: Diagnosis not present

## 2023-09-14 DIAGNOSIS — Z79899 Other long term (current) drug therapy: Secondary | ICD-10-CM | POA: Diagnosis not present

## 2023-09-14 DIAGNOSIS — E785 Hyperlipidemia, unspecified: Secondary | ICD-10-CM | POA: Diagnosis not present

## 2023-09-27 DIAGNOSIS — Z0001 Encounter for general adult medical examination with abnormal findings: Secondary | ICD-10-CM | POA: Diagnosis not present

## 2023-09-27 DIAGNOSIS — I700 Atherosclerosis of aorta: Secondary | ICD-10-CM | POA: Diagnosis not present

## 2023-09-27 DIAGNOSIS — E785 Hyperlipidemia, unspecified: Secondary | ICD-10-CM | POA: Diagnosis not present

## 2023-09-27 DIAGNOSIS — J4540 Moderate persistent asthma, uncomplicated: Secondary | ICD-10-CM | POA: Diagnosis not present

## 2023-09-27 DIAGNOSIS — M7062 Trochanteric bursitis, left hip: Secondary | ICD-10-CM | POA: Diagnosis not present

## 2023-09-27 DIAGNOSIS — Z85820 Personal history of malignant melanoma of skin: Secondary | ICD-10-CM | POA: Diagnosis not present

## 2023-09-27 DIAGNOSIS — M810 Age-related osteoporosis without current pathological fracture: Secondary | ICD-10-CM | POA: Diagnosis not present

## 2024-04-18 ENCOUNTER — Ambulatory Visit
Admission: EM | Admit: 2024-04-18 | Discharge: 2024-04-18 | Disposition: A | Discharge status: Home / Self Care | Attending: Family Medicine | Admitting: Family Medicine

## 2024-04-18 DIAGNOSIS — L309 Dermatitis, unspecified: Secondary | ICD-10-CM | POA: Diagnosis not present

## 2024-04-18 DIAGNOSIS — L0100 Impetigo, unspecified: Secondary | ICD-10-CM

## 2024-04-18 MED ORDER — MUPIROCIN 2 % EX OINT: 1.0000 | TOPICAL_OINTMENT | Freq: Two times a day (BID) | CUTANEOUS | 0 refills | Status: AC

## 2024-04-18 MED ORDER — METHYLPREDNISOLONE ACETATE 80 MG/ML IJ SUSP
80.0000 mg | Freq: Once | INTRAMUSCULAR | Status: AC
  Administered 2024-04-18: 80 mg via INTRAMUSCULAR

## 2024-04-18 NOTE — Discharge Instructions (Signed)
 We have given you a shot today to help with the allergic reaction to your face.  You may continue antihistamine such as Zyrtec daily, topical agents as needed.  I have also prescribed an antibiotic ointment for the area that looks to be getting infected on the right side of your chin.  Follow-up for worsening or unresolving symptoms

## 2024-04-18 NOTE — ED Triage Notes (Signed)
 Pt reports she has had an allergic reaction/rash on her face after receiving a facial mask at an aesthetician  office x 1 week

## 2024-04-18 NOTE — ED Provider Notes (Signed)
 " [facility]    CSN: [id number] Arrival date & time: 04/18/24  1301      History   Chief Complaint No chief complaint on file.   HPI Yesenia Welch is a 74 y.o. female.   Patient presenting today with about a week of itchy red rash to bilateral face after receiving a facial from an aesthetician office.  She has been trying Benadryl, aloe vera, hydrocortisone with minimal relief.  She denies throat itching or swelling, chest tightness, shortness of breath, wheezing, abdominal pain, nausea vomiting or diarrhea.  She also has an open area to the right chin region, no known injury, and is unclear what may have started this area but she states this started at a different time than the facial rash.  Has not been trying anything for this area.    Past Medical History:  Diagnosis Date   Asthma    Keratoconus of both eyes    Osteopenia    PONV (postoperative nausea and vomiting)     Patient Active Problem List   Diagnosis Date Noted   Right ovarian cyst 03/22/2018   Overweight 06/05/2009   CHEST DISCOMFORT 06/05/2009   ABNORMAL ELECTROCARDIOGRAM 06/05/2009    Past Surgical History:  Procedure Laterality Date   BREAST BIOPSY Left 2003   Benign   BREAST LUMPECTOMY  2003   LEFT BREAST   CESAREAN SECTION     October 1975, March 1977   CHOLECYSTECTOMY  1996   COLONOSCOPY  02/12/2012   DILATION AND CURETTAGE OF UTERUS  1973   ENDOMETRIAL ABLATION  2006   UTERINE   ROBOTIC ASSISTED BILATERAL SALPINGO OOPHERECTOMY Bilateral 03/22/2018   Procedure: XI ROBOTIC ASSISTED BILATERAL SALPINGO OOPHORECTOMY WITH PERITONEAL WASHINGS;  Surgeon: Eloy Herring, MD;  Location: [facility];  Service: Gynecology;  Laterality: Bilateral;   SALPINGOOPHORECTOMY     bil Dr. Eloy   03-22-18   TONSILLECTOMY  1956    OB History   No obstetric history on file.      Home Medications    Prior to Admission medications  Medication Sig Start Date End Date Taking? Authorizing Provider   mupirocin  ointment (BACTROBAN ) 2 % Apply 1 Application topically 2 (two) times daily. 04/18/24  Yes Stuart Vernell Norris, PA-C  albuterol (PROVENTIL HFA;VENTOLIN HFA) 108 (90 Base) MCG/ACT inhaler Inhale 1-2 puffs into the lungs as needed for wheezing or shortness of breath.    [provider]  alendronate (FOSAMAX) 70 MG tablet  01/23/20   [provider]  alum & mag hydroxide-simeth (MYLANTA MAXIMUM STRENGTH) 400-400-40 MG/5ML suspension Take 15 mLs by mouth every 6 (six) hours as needed for indigestion. 08/09/22   Leath-Warren, Etta PARAS, NP  benzonatate  (TESSALON ) 100 MG capsule Take 1-2 capsules (100-200 mg total) by mouth 3 (three) times daily as needed for cough. 05/18/21   Christopher Savannah, PA-C  cetirizine (ZYRTEC) 10 MG tablet Take 10 mg by mouth daily.    [provider]  Estradiol 10 MCG TABS vaginal tablet Imvexxy Maintenance Pack 10 mcg vaginal insert  Insert 1 vaginal insert twice a week by vaginal route.    [provider]  fluticasone (FLONASE) 50 MCG/ACT nasal spray Place 2 sprays into both nostrils daily.    [provider]  fluticasone furoate-vilanterol (BREO ELLIPTA) 200-25 MCG/ACT AEPB 1 puff 05/16/21   [provider]  fluticasone furoate-vilanterol (BREO ELLIPTA) 200-25 MCG/INH AEPB Inhale 1 puff into the lungs daily.     [provider]  guaiFENesin  (ROBITUSSIN)  100 MG/5ML liquid Take 10 mLs by mouth every 6 (six) hours as needed for cough or to loosen phlegm. 09/13/23   Leath-Warren, Etta PARAS, NP  ondansetron  (ZOFRAN -ODT) 4 MG disintegrating tablet Take 1 tablet (4 mg total) by mouth every 8 (eight) hours as needed for nausea or vomiting. 08/09/22   Leath-Warren, Etta PARAS, NP  pantoprazole  (PROTONIX ) 40 MG tablet Take 1 tablet (40 mg total) by mouth daily. 08/09/22   Leath-Warren, Etta PARAS, NP  promethazine -dextromethorphan (PROMETHAZINE -DM) 6.25-15 MG/5ML syrup Take 5 mLs by mouth at bedtime as needed for cough.  05/18/21   Christopher Savannah, PA-C  triamcinolone  ointment (KENALOG ) 0.1 % triamcinolone  acetonide 0.1 % topical ointment  APPLY A THIN LAYER TO THE AFFECTED AREA(S) BY TOPICAL ROUTE 2 TIMES PER DAY x 7 days    [provider]    Family History Family History  Problem Relation Age of Onset   Heart attack Father        CABG @ AGE 46   Ovarian cancer Paternal Aunt     Social History Social History[1]   Allergies   Dust mite extract, Gluten meal, and Penicillins   Review of Systems Review of Systems PER HPI  Physical Exam Triage Vital Signs ED Triage Vitals  Encounter Vitals Group     BP 04/18/24 1309 135/89     Girls Systolic BP Percentile --      Girls Diastolic BP Percentile --      Boys Systolic BP Percentile --      Boys Diastolic BP Percentile --      Pulse Rate 04/18/24 1309 67     Resp 04/18/24 1309 18     Temp 04/18/24 1309 98.1 F (36.7 C)     Temp Source 04/18/24 1309 Oral     SpO2 04/18/24 1309 97 %     Weight --      Height --      Head Circumference --      Peak Flow --      Pain Score 04/18/24 1308 0     Pain Loc --      Pain Education --      Exclude from Growth Chart --    No data found.  Updated Vital Signs BP 135/89 (BP Location: Right Arm)   Pulse 67   Temp 98.1 F (36.7 C) (Oral)   Resp 18   SpO2 97%   Visual Acuity Right Eye Distance:   Left Eye Distance:   Bilateral Distance:    Right Eye Near:   Left Eye Near:    Bilateral Near:     Physical Exam Vitals and nursing note reviewed.  Constitutional:      Appearance: Normal appearance. She is not ill-appearing.  HENT:     Head: Atraumatic.  Eyes:     Extraocular Movements: Extraocular movements intact.     Conjunctiva/sclera: Conjunctivae normal.  Cardiovascular:     Rate and Rhythm: Normal rate.  Pulmonary:     Effort: Pulmonary effort is normal.  Musculoskeletal:        General: Normal range of motion.     Cervical back: Normal range of motion and neck supple.   Skin:    General: Skin is warm.     Findings: Rash present.     Comments: Bilateral cheeks, forehead with erythematous maculopapular rash diffusely.  Ulcerated crusted lesion to the right chin region  Neurological:     Mental Status: She is alert and oriented to person,  place, and time.  Psychiatric:        Mood and Affect: Mood normal.        Thought Content: Thought content normal.        Judgment: Judgment normal.      UC Treatments / Results  Labs (all labs ordered are listed, but only abnormal results are displayed) Labs Reviewed - No data to display  EKG   Radiology No results found.  Procedures Procedures (including critical care time)  Medications Ordered in UC Medications  methylPREDNISolone  acetate (DEPO-MEDROL ) injection 80 mg (80 mg Intramuscular Given 04/18/24 1357)    Initial Impression / Assessment and Plan / UC Course  I have reviewed the triage vital signs and the nursing notes.  Pertinent labs & imaging results that were available during my care of the patient were reviewed by me and considered in my medical decision making (see chart for details).     Will treat the facial dermatitis with IM Depo-Medrol , antihistamines and avoidance of further scented or irritating products to the face.  The other area does appear to be developing impetigo so we will treat with mupirocin  and good topical care.  Return for worsening or unresolving symptoms.  Final Clinical Impressions(s) / UC Diagnoses   Final diagnoses:  Facial dermatitis  Impetigo     Discharge Instructions      We have given you a shot today to help with the allergic reaction to your face.  You may continue antihistamine such as Zyrtec daily, topical agents as needed.  I have also prescribed an antibiotic ointment for the area that looks to be getting infected on the right side of your chin.  Follow-up for worsening or unresolving symptoms    ED Prescriptions     Medication Sig Dispense  Auth. Provider   mupirocin  ointment (BACTROBAN ) 2 % Apply 1 Application topically 2 (two) times daily. 60 g Stuart Vernell Norris, [place]      PDMP not reviewed this encounter.    [1]  Social History Tobacco Use   Smoking status: Never   Smokeless tobacco: Never  Vaping Use   Vaping status: Never Used  Substance Use Topics   Alcohol use: Yes    Alcohol/week: 1.0 standard drink of alcohol    Types: 1 Glasses of wine per week    Comment: daily   Drug use: No     Stuart Vernell Norris, PA-C 04/18/24 1407  "
